# Patient Record
Sex: Male | Born: 1960 | Hispanic: No | Marital: Married | Smoking: Never smoker
Health system: Southern US, Community
[De-identification: ages and names within clinical notes are randomized; demographics above are authoritative.]

## PROBLEM LIST (undated history)

## (undated) DIAGNOSIS — K219 Gastro-esophageal reflux disease without esophagitis: Secondary | ICD-10-CM

## (undated) DIAGNOSIS — I10 Essential (primary) hypertension: Secondary | ICD-10-CM

## (undated) DIAGNOSIS — E119 Type 2 diabetes mellitus without complications: Secondary | ICD-10-CM

---

## 2007-08-25 ENCOUNTER — Encounter: Admission: RE | Admit: 2007-08-25 | Discharge: 2007-08-25 | Payer: Self-pay | Admitting: General Surgery

## 2007-08-29 ENCOUNTER — Encounter (INDEPENDENT_AMBULATORY_CARE_PROVIDER_SITE_OTHER): Payer: Self-pay | Admitting: General Surgery

## 2007-08-29 ENCOUNTER — Ambulatory Visit (HOSPITAL_BASED_OUTPATIENT_CLINIC_OR_DEPARTMENT_OTHER): Admission: RE | Admit: 2007-08-29 | Discharge: 2007-08-29 | Payer: Self-pay | Admitting: General Surgery

## 2009-08-12 ENCOUNTER — Encounter: Admission: RE | Admit: 2009-08-12 | Discharge: 2009-08-12 | Payer: Self-pay | Admitting: Specialist

## 2011-03-03 NOTE — Op Note (Signed)
[name], Kenneth Perez NO.:  [id number]   MEDICAL RECORD NO.:  [id number]          PATIENT TYPE:  AMB   LOCATION:  DSC                          FACILITY:  [facility]   PHYSICIAN:  Cherylynn Ridges, M.D.    DATE OF BIRTH:  Nov 14, 1960   DATE OF PROCEDURE:  08/29/2007  DATE OF DISCHARGE:                               OPERATIVE REPORT   PREOPERATIVE DIAGNOSES:  Perianal cyst with perianal pain.   POSTOPERATIVE DIAGNOSES:  1. Perianal cyst with perianal pain.  2. Internal hemorrhoid at the 11 o'clock position.   PROCEDURE:  1. Exam under anesthesia.  2. Excision of perianal cyst.  3. Internal hemorrhoid rubber band ligation.   SURGEON:  Dr. Jimmye Norman.   ANESTHESIA:  General with a laryngeal airway.   ESTIMATED BLOOD LOSS:  Less than 20 mL.   COMPLICATIONS:  None.   CONDITION:  Stable.   FINDINGS:  The patient had a 1 cm cyst at the 5 o'clock position with 12  o'clock being directly anterior in the lithotomy position.  There was no  associated hemorrhoidal disease with the cyst.  At approximately the 11  o'clock position, there was a very friable and enlarged and inflamed  internal hemorrhoid which we ligated with a rubber band.   DESCRIPTION OF PROCEDURE:  The patient was taken to the operating room,  placed on the table in supine position.  After an adequate general  laryngeal airway anesthetic was administered, he was placed in lithotomy  position and then prepped and draped in the usual sterile manner.   We placed an anal speculum inside and inspected the area around the  perianal cyst which could be seen externally.  3-0 chromic stitch was  placed just above the dentate line and ligated and then we excised the  perianal cyst at the 5 o'clock position and sent it as a specimen.  We  ran the locking stitch of 3-0 chromic up to the skin leaving a small  area open for drainage.   We readjusted the anal speculum in order to inspect the circumferential  area  of the hemorrhoidal vessels.  The only friable and inflamed  hemorrhoidal disease was noted at the 11 o'clock position and because it  was only mild disease and not causing significant prolapse, we were able  to ligate that using a rubber band ligator with good results above  the dentate line.  We then placed a roll of Gelfoam completely covered  with dibucaine ointment into the anus and injected around the perianal  area with 0.25% Marcaine without epi.  All counts were correct.  A  sterile dressing was applied.      Cherylynn Ridges, M.D.  Electronically Signed     JOW/MEDQ  D:  08/29/2007  T:  08/29/2007  Job:  [id number]   cc:   Markham Jordan L. Effie Shy, M.D.

## 2011-04-30 ENCOUNTER — Emergency Department (HOSPITAL_COMMUNITY): Payer: BC Managed Care – PPO

## 2011-04-30 ENCOUNTER — Emergency Department (HOSPITAL_COMMUNITY)
Admission: EM | Admit: 2011-04-30 | Discharge: 2011-04-30 | Disposition: A | Discharge status: Home / Self Care | Payer: BC Managed Care – PPO | Attending: Emergency Medicine | Admitting: Emergency Medicine

## 2011-04-30 DIAGNOSIS — R042 Hemoptysis: Secondary | ICD-10-CM | POA: Insufficient documentation

## 2011-04-30 LAB — URINALYSIS, ROUTINE W REFLEX MICROSCOPIC
Bilirubin Urine: NEGATIVE
Glucose, UA: NEGATIVE mg/dL
Ketones, ur: NEGATIVE mg/dL
Leukocytes, UA: NEGATIVE
Nitrite: NEGATIVE
Protein, ur: NEGATIVE mg/dL
Specific Gravity, Urine: 1.026 (ref 1.005–1.030)
Urobilinogen, UA: 1 mg/dL (ref 0.0–1.0)
pH: 6.5 (ref 5.0–8.0)

## 2011-04-30 LAB — DIFFERENTIAL
Basophils Absolute: 0 10*3/uL (ref 0.0–0.1)
Basophils Relative: 1 % (ref 0–1)
Eosinophils Absolute: 0.1 10*3/uL (ref 0.0–0.7)
Eosinophils Relative: 1 % (ref 0–5)
Lymphocytes Relative: 43 % (ref 12–46)
Lymphs Abs: 3 10*3/uL (ref 0.7–4.0)
Monocytes Absolute: 0.6 10*3/uL (ref 0.1–1.0)
Monocytes Relative: 8 % (ref 3–12)
Neutro Abs: 3.3 10*3/uL (ref 1.7–7.7)
Neutrophils Relative %: 48 % (ref 43–77)

## 2011-04-30 LAB — CBC
HCT: 41.5 % (ref 39.0–52.0)
Hemoglobin: 15.1 g/dL (ref 13.0–17.0)
MCH: 28.3 pg (ref 26.0–34.0)
MCHC: 36.4 g/dL — ABNORMAL HIGH (ref 30.0–36.0)
MCV: 77.9 fL — ABNORMAL LOW (ref 78.0–100.0)
Platelets: 209 10*3/uL (ref 150–400)
RBC: 5.33 MIL/uL (ref 4.22–5.81)
RDW: 13 % (ref 11.5–15.5)
WBC: 7 10*3/uL (ref 4.0–10.5)

## 2011-04-30 LAB — URINE MICROSCOPIC-ADD ON

## 2011-07-28 LAB — DIFFERENTIAL
Basophils Absolute: 0.1
Basophils Relative: 1
Eosinophils Absolute: 0.1
Eosinophils Relative: 1
Lymphocytes Relative: 35
Lymphs Abs: 2.3
Monocytes Absolute: 0.5
Monocytes Relative: 7
Neutro Abs: 3.6
Neutrophils Relative %: 56

## 2011-07-28 LAB — CBC
HCT: 49.3
Hemoglobin: 16.5
MCHC: 33.6
MCV: 81.8
Platelets: 239
RBC: 6.02 — ABNORMAL HIGH
RDW: 13.5
WBC: 6.6

## 2011-07-28 LAB — BASIC METABOLIC PANEL
BUN: 11
CO2: 29
Calcium: 9.2
Chloride: 102
Creatinine, Ser: 0.83
GFR calc Af Amer: >60
GFR calc non Af Amer: >60
Glucose, Bld: 124 — ABNORMAL HIGH
Potassium: 4.1
Sodium: 138

## 2011-07-28 LAB — POCT HEMOGLOBIN-HEMACUE
Hemoglobin: 17.2 — ABNORMAL HIGH
Operator id: 123881

## 2016-08-26 ENCOUNTER — Emergency Department (HOSPITAL_COMMUNITY)
Admission: EM | Admit: 2016-08-26 | Discharge: 2016-08-26 | Disposition: A | Discharge status: Home / Self Care | Payer: No Typology Code available for payment source | Attending: Emergency Medicine | Admitting: Emergency Medicine

## 2016-08-26 ENCOUNTER — Emergency Department (HOSPITAL_COMMUNITY): Payer: No Typology Code available for payment source

## 2016-08-26 ENCOUNTER — Encounter (HOSPITAL_COMMUNITY): Payer: Self-pay | Admitting: Emergency Medicine

## 2016-08-26 DIAGNOSIS — R930 Abnormal findings on diagnostic imaging of skull and head, not elsewhere classified: Secondary | ICD-10-CM | POA: Diagnosis not present

## 2016-08-26 DIAGNOSIS — R938 Abnormal findings on diagnostic imaging of other specified body structures: Secondary | ICD-10-CM | POA: Insufficient documentation

## 2016-08-26 DIAGNOSIS — Y939 Activity, unspecified: Secondary | ICD-10-CM | POA: Insufficient documentation

## 2016-08-26 DIAGNOSIS — S199XXA Unspecified injury of neck, initial encounter: Secondary | ICD-10-CM | POA: Diagnosis present

## 2016-08-26 DIAGNOSIS — Y92410 Unspecified street and highway as the place of occurrence of the external cause: Secondary | ICD-10-CM | POA: Insufficient documentation

## 2016-08-26 DIAGNOSIS — Y999 Unspecified external cause status: Secondary | ICD-10-CM | POA: Insufficient documentation

## 2016-08-26 LAB — I-STAT CHEM 8, ED
BUN: 15 mg/dL (ref 6–20)
Calcium, Ion: 1.09 mmol/L — ABNORMAL LOW (ref 1.15–1.40)
Chloride: 102 mmol/L (ref 101–111)
Creatinine, Ser: 0.7 mg/dL (ref 0.61–1.24)
Glucose, Bld: 136 mg/dL — ABNORMAL HIGH (ref 65–99)
HCT: 48 % (ref 39.0–52.0)
Hemoglobin: 16.3 g/dL (ref 13.0–17.0)
Potassium: 3.6 mmol/L (ref 3.5–5.1)
Sodium: 138 mmol/L (ref 135–145)
TCO2: 25 mmol/L (ref 0–100)

## 2016-08-26 MED ORDER — IOPAMIDOL (ISOVUE-300) INJECTION 61%
INTRAVENOUS | Status: AC
  Administered 2016-08-26: 85 mL via INTRAVENOUS
  Filled 2016-08-26: qty 100

## 2016-08-26 MED ORDER — METHOCARBAMOL 500 MG PO TABS: 500.0000 mg | ORAL_TABLET | Freq: Two times a day (BID) | ORAL | 0 refills | Status: DC
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
## 2016-08-26 NOTE — ED Notes (Signed)
Pt's wife continually coming to desk, asking to leave.
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
## 2016-08-26 NOTE — Discharge Instructions (Signed)
You have a small inguinal hernia on the right side. If it becomes more swollen and painful follow-up with the surgeon.
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
## 2017-04-26 DIAGNOSIS — Z131 Encounter for screening for diabetes mellitus: Secondary | ICD-10-CM | POA: Diagnosis not present

## 2017-04-26 DIAGNOSIS — Z13220 Encounter for screening for lipoid disorders: Secondary | ICD-10-CM | POA: Diagnosis not present

## 2017-04-26 DIAGNOSIS — M25552 Pain in left hip: Secondary | ICD-10-CM | POA: Diagnosis not present

## 2017-04-26 DIAGNOSIS — Z125 Encounter for screening for malignant neoplasm of prostate: Secondary | ICD-10-CM | POA: Diagnosis not present

## 2017-04-26 DIAGNOSIS — K219 Gastro-esophageal reflux disease without esophagitis: Secondary | ICD-10-CM | POA: Diagnosis not present

## 2017-04-26 DIAGNOSIS — I10 Essential (primary) hypertension: Secondary | ICD-10-CM | POA: Diagnosis not present

## 2017-04-26 DIAGNOSIS — R7303 Prediabetes: Secondary | ICD-10-CM | POA: Diagnosis not present

## 2017-04-26 DIAGNOSIS — M25559 Pain in unspecified hip: Secondary | ICD-10-CM | POA: Diagnosis not present

## 2017-05-06 DIAGNOSIS — R7303 Prediabetes: Secondary | ICD-10-CM | POA: Diagnosis not present

## 2017-05-06 DIAGNOSIS — E669 Obesity, unspecified: Secondary | ICD-10-CM | POA: Diagnosis not present

## 2017-05-06 DIAGNOSIS — K219 Gastro-esophageal reflux disease without esophagitis: Secondary | ICD-10-CM | POA: Diagnosis not present

## 2017-05-06 DIAGNOSIS — I10 Essential (primary) hypertension: Secondary | ICD-10-CM | POA: Diagnosis not present

## 2017-05-11 DIAGNOSIS — H5203 Hypermetropia, bilateral: Secondary | ICD-10-CM | POA: Diagnosis not present

## 2017-06-11 DIAGNOSIS — K219 Gastro-esophageal reflux disease without esophagitis: Secondary | ICD-10-CM | POA: Diagnosis not present

## 2017-06-11 DIAGNOSIS — M25559 Pain in unspecified hip: Secondary | ICD-10-CM | POA: Diagnosis not present

## 2017-06-11 DIAGNOSIS — I10 Essential (primary) hypertension: Secondary | ICD-10-CM | POA: Diagnosis not present

## 2017-06-11 DIAGNOSIS — R7303 Prediabetes: Secondary | ICD-10-CM | POA: Diagnosis not present

## 2017-08-30 DIAGNOSIS — R7303 Prediabetes: Secondary | ICD-10-CM | POA: Diagnosis not present

## 2017-08-30 DIAGNOSIS — N3001 Acute cystitis with hematuria: Secondary | ICD-10-CM | POA: Diagnosis not present

## 2017-08-30 DIAGNOSIS — I10 Essential (primary) hypertension: Secondary | ICD-10-CM | POA: Diagnosis not present

## 2017-10-08 DIAGNOSIS — N139 Obstructive and reflux uropathy, unspecified: Secondary | ICD-10-CM | POA: Diagnosis not present

## 2017-10-08 DIAGNOSIS — I10 Essential (primary) hypertension: Secondary | ICD-10-CM | POA: Diagnosis not present

## 2017-10-08 DIAGNOSIS — E1165 Type 2 diabetes mellitus with hyperglycemia: Secondary | ICD-10-CM | POA: Diagnosis not present

## 2017-10-08 DIAGNOSIS — K219 Gastro-esophageal reflux disease without esophagitis: Secondary | ICD-10-CM | POA: Diagnosis not present

## 2018-02-04 DIAGNOSIS — Z125 Encounter for screening for malignant neoplasm of prostate: Secondary | ICD-10-CM | POA: Diagnosis not present

## 2018-02-04 DIAGNOSIS — K219 Gastro-esophageal reflux disease without esophagitis: Secondary | ICD-10-CM | POA: Diagnosis not present

## 2018-02-04 DIAGNOSIS — I10 Essential (primary) hypertension: Secondary | ICD-10-CM | POA: Diagnosis not present

## 2018-02-04 DIAGNOSIS — E1165 Type 2 diabetes mellitus with hyperglycemia: Secondary | ICD-10-CM | POA: Diagnosis not present

## 2018-02-04 DIAGNOSIS — M25519 Pain in unspecified shoulder: Secondary | ICD-10-CM | POA: Diagnosis not present

## 2018-03-25 DIAGNOSIS — N139 Obstructive and reflux uropathy, unspecified: Secondary | ICD-10-CM | POA: Diagnosis not present

## 2018-03-25 DIAGNOSIS — I10 Essential (primary) hypertension: Secondary | ICD-10-CM | POA: Diagnosis not present

## 2018-03-25 DIAGNOSIS — E1165 Type 2 diabetes mellitus with hyperglycemia: Secondary | ICD-10-CM | POA: Diagnosis not present

## 2018-03-25 DIAGNOSIS — Z1211 Encounter for screening for malignant neoplasm of colon: Secondary | ICD-10-CM | POA: Diagnosis not present

## 2018-03-25 DIAGNOSIS — E7849 Other hyperlipidemia: Secondary | ICD-10-CM | POA: Diagnosis not present

## 2018-04-13 ENCOUNTER — Encounter: Payer: Self-pay | Admitting: Emergency Medicine

## 2018-04-13 ENCOUNTER — Emergency Department
Admission: EM | Admit: 2018-04-13 | Discharge: 2018-04-13 | Disposition: A | Discharge status: Home / Self Care | Payer: Worker's Compensation | Attending: Emergency Medicine | Admitting: Emergency Medicine

## 2018-04-13 ENCOUNTER — Other Ambulatory Visit: Payer: Self-pay

## 2018-04-13 DIAGNOSIS — Z23 Encounter for immunization: Secondary | ICD-10-CM | POA: Insufficient documentation

## 2018-04-13 DIAGNOSIS — S51812A Laceration without foreign body of left forearm, initial encounter: Secondary | ICD-10-CM | POA: Insufficient documentation

## 2018-04-13 DIAGNOSIS — Y9389 Activity, other specified: Secondary | ICD-10-CM | POA: Insufficient documentation

## 2018-04-13 DIAGNOSIS — I10 Essential (primary) hypertension: Secondary | ICD-10-CM | POA: Insufficient documentation

## 2018-04-13 DIAGNOSIS — W268XXA Contact with other sharp object(s), not elsewhere classified, initial encounter: Secondary | ICD-10-CM | POA: Insufficient documentation

## 2018-04-13 DIAGNOSIS — Y990 Civilian activity done for income or pay: Secondary | ICD-10-CM | POA: Insufficient documentation

## 2018-04-13 DIAGNOSIS — Y9289 Other specified places as the place of occurrence of the external cause: Secondary | ICD-10-CM | POA: Insufficient documentation

## 2018-04-13 HISTORY — DX: Gastro-esophageal reflux disease without esophagitis: K21.9

## 2018-04-13 HISTORY — DX: Essential (primary) hypertension: I10

## 2018-04-13 MED ORDER — LIDOCAINE HCL (PF) 1 % IJ SOLN: 5.0000 mL | Freq: Once | INTRAMUSCULAR | Status: AC

## 2018-04-13 MED ORDER — LIDOCAINE HCL (PF) 1 % IJ SOLN
INTRAMUSCULAR | Status: AC
  Administered 2018-04-13: 5 mL
  Filled 2018-04-13: qty 5

## 2018-04-13 MED ORDER — TETANUS-DIPHTH-ACELL PERTUSSIS 5-2.5-18.5 LF-MCG/0.5 IM SUSP
0.5000 mL | Freq: Once | INTRAMUSCULAR | Status: AC
  Administered 2018-04-13: 0.5 mL via INTRAMUSCULAR
  Filled 2018-04-13: qty 0.5

## 2018-04-13 MED ORDER — TRAMADOL HCL 50 MG PO TABS: 50.0000 mg | ORAL_TABLET | Freq: Two times a day (BID) | ORAL | 0 refills | Status: AC | PRN

## 2018-04-13 MED ORDER — BACITRACIN-NEOMYCIN-POLYMYXIN 400-5-5000 EX OINT
TOPICAL_OINTMENT | Freq: Once | CUTANEOUS | Status: AC
  Administered 2018-04-13: 1 via TOPICAL
  Filled 2018-04-13: qty 1

## 2018-05-06 DIAGNOSIS — E1165 Type 2 diabetes mellitus with hyperglycemia: Secondary | ICD-10-CM | POA: Diagnosis not present

## 2018-05-06 DIAGNOSIS — K219 Gastro-esophageal reflux disease without esophagitis: Secondary | ICD-10-CM | POA: Diagnosis not present

## 2018-05-06 DIAGNOSIS — I10 Essential (primary) hypertension: Secondary | ICD-10-CM | POA: Diagnosis not present

## 2018-05-06 DIAGNOSIS — S41009A Unspecified open wound of unspecified shoulder, initial encounter: Secondary | ICD-10-CM | POA: Diagnosis not present

## 2018-06-30 DIAGNOSIS — I10 Essential (primary) hypertension: Secondary | ICD-10-CM | POA: Diagnosis not present

## 2018-06-30 DIAGNOSIS — K219 Gastro-esophageal reflux disease without esophagitis: Secondary | ICD-10-CM | POA: Diagnosis not present

## 2018-06-30 DIAGNOSIS — Z418 Encounter for other procedures for purposes other than remedying health state: Secondary | ICD-10-CM | POA: Diagnosis not present

## 2018-06-30 DIAGNOSIS — E1165 Type 2 diabetes mellitus with hyperglycemia: Secondary | ICD-10-CM | POA: Diagnosis not present
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
## 2018-11-15 DIAGNOSIS — I10 Essential (primary) hypertension: Secondary | ICD-10-CM | POA: Diagnosis not present

## 2018-11-15 DIAGNOSIS — E1165 Type 2 diabetes mellitus with hyperglycemia: Secondary | ICD-10-CM | POA: Diagnosis not present

## 2018-11-15 DIAGNOSIS — J302 Other seasonal allergic rhinitis: Secondary | ICD-10-CM | POA: Diagnosis not present

## 2018-11-15 DIAGNOSIS — K219 Gastro-esophageal reflux disease without esophagitis: Secondary | ICD-10-CM | POA: Diagnosis not present

## 2018-12-27 DIAGNOSIS — K219 Gastro-esophageal reflux disease without esophagitis: Secondary | ICD-10-CM | POA: Diagnosis not present

## 2018-12-27 DIAGNOSIS — E1165 Type 2 diabetes mellitus with hyperglycemia: Secondary | ICD-10-CM | POA: Diagnosis not present

## 2018-12-27 DIAGNOSIS — I10 Essential (primary) hypertension: Secondary | ICD-10-CM | POA: Diagnosis not present

## 2018-12-27 DIAGNOSIS — J302 Other seasonal allergic rhinitis: Secondary | ICD-10-CM | POA: Diagnosis not present

## 2019-05-29 DIAGNOSIS — I10 Essential (primary) hypertension: Secondary | ICD-10-CM | POA: Diagnosis not present

## 2019-05-29 DIAGNOSIS — E559 Vitamin D deficiency, unspecified: Secondary | ICD-10-CM | POA: Diagnosis not present

## 2019-05-29 DIAGNOSIS — Z0000 Encounter for general adult medical examination without abnormal findings: Secondary | ICD-10-CM | POA: Diagnosis not present

## 2019-05-29 DIAGNOSIS — E1165 Type 2 diabetes mellitus with hyperglycemia: Secondary | ICD-10-CM | POA: Diagnosis not present

## 2019-05-29 DIAGNOSIS — K219 Gastro-esophageal reflux disease without esophagitis: Secondary | ICD-10-CM | POA: Diagnosis not present

## 2019-05-29 DIAGNOSIS — Z125 Encounter for screening for malignant neoplasm of prostate: Secondary | ICD-10-CM | POA: Diagnosis not present

## 2019-10-30 DIAGNOSIS — M25579 Pain in unspecified ankle and joints of unspecified foot: Secondary | ICD-10-CM | POA: Diagnosis not present

## 2019-10-30 DIAGNOSIS — I10 Essential (primary) hypertension: Secondary | ICD-10-CM | POA: Diagnosis not present

## 2019-10-30 DIAGNOSIS — E1165 Type 2 diabetes mellitus with hyperglycemia: Secondary | ICD-10-CM | POA: Diagnosis not present

## 2019-10-30 DIAGNOSIS — K219 Gastro-esophageal reflux disease without esophagitis: Secondary | ICD-10-CM | POA: Diagnosis not present

## 2019-12-11 DIAGNOSIS — K219 Gastro-esophageal reflux disease without esophagitis: Secondary | ICD-10-CM | POA: Diagnosis not present

## 2019-12-11 DIAGNOSIS — N529 Male erectile dysfunction, unspecified: Secondary | ICD-10-CM | POA: Diagnosis not present

## 2019-12-11 DIAGNOSIS — I10 Essential (primary) hypertension: Secondary | ICD-10-CM | POA: Diagnosis not present

## 2019-12-11 DIAGNOSIS — E1165 Type 2 diabetes mellitus with hyperglycemia: Secondary | ICD-10-CM | POA: Diagnosis not present

## 2019-12-21 DIAGNOSIS — Z20828 Contact with and (suspected) exposure to other viral communicable diseases: Secondary | ICD-10-CM | POA: Diagnosis not present

## 2019-12-22 DIAGNOSIS — Z20828 Contact with and (suspected) exposure to other viral communicable diseases: Secondary | ICD-10-CM | POA: Diagnosis not present

## 2020-01-18 DIAGNOSIS — I10 Essential (primary) hypertension: Secondary | ICD-10-CM | POA: Diagnosis not present

## 2020-01-18 DIAGNOSIS — M179 Osteoarthritis of knee, unspecified: Secondary | ICD-10-CM | POA: Diagnosis not present

## 2020-01-18 DIAGNOSIS — K219 Gastro-esophageal reflux disease without esophagitis: Secondary | ICD-10-CM | POA: Diagnosis not present

## 2020-01-18 DIAGNOSIS — E1165 Type 2 diabetes mellitus with hyperglycemia: Secondary | ICD-10-CM | POA: Diagnosis not present

## 2020-02-05 DIAGNOSIS — E1165 Type 2 diabetes mellitus with hyperglycemia: Secondary | ICD-10-CM | POA: Diagnosis not present

## 2020-02-05 DIAGNOSIS — K219 Gastro-esophageal reflux disease without esophagitis: Secondary | ICD-10-CM | POA: Diagnosis not present

## 2020-02-05 DIAGNOSIS — M179 Osteoarthritis of knee, unspecified: Secondary | ICD-10-CM | POA: Diagnosis not present

## 2020-02-05 DIAGNOSIS — I10 Essential (primary) hypertension: Secondary | ICD-10-CM | POA: Diagnosis not present

## 2020-03-29 DIAGNOSIS — K219 Gastro-esophageal reflux disease without esophagitis: Secondary | ICD-10-CM | POA: Diagnosis not present

## 2020-03-29 DIAGNOSIS — E1165 Type 2 diabetes mellitus with hyperglycemia: Secondary | ICD-10-CM | POA: Diagnosis not present

## 2020-03-29 DIAGNOSIS — I10 Essential (primary) hypertension: Secondary | ICD-10-CM | POA: Diagnosis not present

## 2020-03-29 DIAGNOSIS — M179 Osteoarthritis of knee, unspecified: Secondary | ICD-10-CM | POA: Diagnosis not present

## 2020-05-24 DIAGNOSIS — Z0000 Encounter for general adult medical examination without abnormal findings: Secondary | ICD-10-CM | POA: Diagnosis not present

## 2020-05-24 DIAGNOSIS — Z13220 Encounter for screening for lipoid disorders: Secondary | ICD-10-CM | POA: Diagnosis not present

## 2020-05-24 DIAGNOSIS — Z125 Encounter for screening for malignant neoplasm of prostate: Secondary | ICD-10-CM | POA: Diagnosis not present

## 2020-05-24 DIAGNOSIS — E1165 Type 2 diabetes mellitus with hyperglycemia: Secondary | ICD-10-CM | POA: Diagnosis not present

## 2020-05-24 DIAGNOSIS — I10 Essential (primary) hypertension: Secondary | ICD-10-CM | POA: Diagnosis not present

## 2020-05-24 DIAGNOSIS — E559 Vitamin D deficiency, unspecified: Secondary | ICD-10-CM | POA: Diagnosis not present

## 2020-05-24 DIAGNOSIS — M179 Osteoarthritis of knee, unspecified: Secondary | ICD-10-CM | POA: Diagnosis not present

## 2020-07-05 DIAGNOSIS — K219 Gastro-esophageal reflux disease without esophagitis: Secondary | ICD-10-CM | POA: Diagnosis not present

## 2020-07-05 DIAGNOSIS — E1165 Type 2 diabetes mellitus with hyperglycemia: Secondary | ICD-10-CM | POA: Diagnosis not present

## 2020-07-05 DIAGNOSIS — M179 Osteoarthritis of knee, unspecified: Secondary | ICD-10-CM | POA: Diagnosis not present

## 2020-07-05 DIAGNOSIS — I10 Essential (primary) hypertension: Secondary | ICD-10-CM | POA: Diagnosis not present

## 2020-08-07 ENCOUNTER — Other Ambulatory Visit: Payer: Self-pay

## 2020-08-07 ENCOUNTER — Ambulatory Visit: Payer: BC Managed Care – PPO | Admitting: Podiatry

## 2020-08-07 ENCOUNTER — Encounter: Payer: Self-pay | Admitting: Podiatry

## 2020-08-07 DIAGNOSIS — M2580 Other specified joint disorders, unspecified joint: Secondary | ICD-10-CM

## 2020-08-07 DIAGNOSIS — Q666 Other congenital valgus deformities of feet: Secondary | ICD-10-CM | POA: Diagnosis not present

## 2020-08-07 NOTE — Progress Notes (Signed)
Subjective:  Patient ID: Kenneth Perez, male    DOB: [birth year],  MRN: [id number]  Chief Complaint  Patient presents with  . routine foot care    diabetic foot exam     59 y.o. male presents with the above complaint.  Patient presents with a complaint of left submetatarsal 1 pain that has been going on for quite some time.  Patient stated constantly works on his foot.  He works with [employer] on.  He would like to know if there is any kind of orthotics or insoles that he could obtain that could help take some of the pain away as well as if there is any other treatment options.  He has not seen anyone else prior to seeing me.  He denies any other acute complaints.   Review of Systems: Negative except as noted in the HPI. Denies N/V/F/Ch.  History reviewed. No pertinent past medical history.  Current Outpatient Medications:  .  celecoxib (CELEBREX) 200 MG capsule, Take 200 mg by mouth daily., Disp: , Rfl:  .  ibuprofen (ADVIL,MOTRIN) 200 MG tablet, Take 200 mg by mouth every 6 (six) hours as needed for mild pain., Disp: , Rfl:  .  losartan-hydrochlorothiazide (HYZAAR) 100-12.5 MG tablet, Take 1 tablet by mouth daily., Disp: , Rfl:  .  methocarbamol (ROBAXIN) 500 MG tablet, Take 1 tablet (500 mg total) by mouth 2 (two) times daily., Disp: 10 tablet, Rfl: 0 .  naproxen (NAPROSYN) 500 MG tablet, Take 500 mg by mouth 2 (two) times daily., Disp: , Rfl:  .  omeprazole (PRILOSEC) 20 MG capsule, Take 20 mg by mouth 2 (two) times daily., Disp: , Rfl: 5 .  tamsulosin (FLOMAX) 0.4 MG CAPS capsule, Take 0.4 mg by mouth daily., Disp: , Rfl:  .  Vitamin D, Ergocalciferol, (DRISDOL) 1.25 MG (50000 UNIT) CAPS capsule, Take 50,000 Units by mouth once a week., Disp: , Rfl:   Social History   Tobacco Use  Smoking Status Never Smoker  Smokeless Tobacco Never Used    No Known Allergies Objective:  There were no vitals filed for this visit. There is no height or weight on file to calculate  BMI. Constitutional Well developed. Well nourished.  Vascular Dorsalis pedis pulses palpable bilaterally. Posterior tibial pulses palpable bilaterally. Capillary refill normal to all digits.  No cyanosis or clubbing noted. Pedal hair growth normal.  Neurologic Normal speech. Oriented to person, place, and time. Epicritic sensation to light touch grossly present bilaterally.  Dermatologic Nails well groomed and normal in appearance. No open wounds. No skin lesions.  Orthopedic:  Pain on palpation to left submetatarsal 1.  No intra-articular pain at the first MTPJ joint.  Pain on palpation to the tibial sesamoid complex.  No pain with range of motion of the first MPJ joint active and passive.   Radiographs: None Assessment:   1. Sesamoiditis   2. Pes planovalgus    Plan:  Patient was evaluated and treated and all questions answered.  Left foot sesamoiditis -I explained to the patient the etiology of sesamoiditis and various treatment options were discussed.  Given that patient is having a lot of pain I believe he will benefit from steroid injection to help decrease acute inflammatory component associated with pain.  Patient agrees with the plan would like to proceed with a steroid injection -A steroid injection was performed at left sesamoidal complex using 1% plain Lidocaine and 10 mg of Kenalog. This was well tolerated.  Pes planovalgus rigid -I explained to  patient the etiology of pes planovalgus and various treatment options were extensively discussed.  I believe he will benefit from custom-made orthotics to help control hindfoot motion as well as support the arch of the foot.  Patient agrees with the plan would like to obtain orthotics. -He will be scheduled to see [facility] for custom-made orthotics   Return in about 6 weeks (around 09/18/2020) for Precious Gilchrest .

## 2020-08-19 ENCOUNTER — Other Ambulatory Visit: Payer: Self-pay

## 2020-08-19 ENCOUNTER — Ambulatory Visit (INDEPENDENT_AMBULATORY_CARE_PROVIDER_SITE_OTHER): Payer: BC Managed Care – PPO | Admitting: Orthotics

## 2020-08-19 DIAGNOSIS — M2580 Other specified joint disorders, unspecified joint: Secondary | ICD-10-CM | POA: Diagnosis not present

## 2020-08-19 DIAGNOSIS — Q666 Other congenital valgus deformities of feet: Secondary | ICD-10-CM

## 2020-08-19 NOTE — Progress Notes (Signed)
F/O cast toay to address b/l sesmoiditis l > R; plan on k-wedge type of device.

## 2020-08-22 ENCOUNTER — Other Ambulatory Visit: Payer: BC Managed Care – PPO | Admitting: Orthotics

## 2020-08-23 DIAGNOSIS — E1165 Type 2 diabetes mellitus with hyperglycemia: Secondary | ICD-10-CM | POA: Diagnosis not present

## 2020-08-23 DIAGNOSIS — K219 Gastro-esophageal reflux disease without esophagitis: Secondary | ICD-10-CM | POA: Diagnosis not present

## 2020-08-23 DIAGNOSIS — Z23 Encounter for immunization: Secondary | ICD-10-CM | POA: Diagnosis not present

## 2020-08-23 DIAGNOSIS — M179 Osteoarthritis of knee, unspecified: Secondary | ICD-10-CM | POA: Diagnosis not present

## 2020-08-23 DIAGNOSIS — I10 Essential (primary) hypertension: Secondary | ICD-10-CM | POA: Diagnosis not present

## 2020-09-16 ENCOUNTER — Ambulatory Visit: Payer: BC Managed Care – PPO | Admitting: Orthotics

## 2020-09-16 ENCOUNTER — Other Ambulatory Visit: Payer: Self-pay

## 2020-09-16 DIAGNOSIS — Q666 Other congenital valgus deformities of feet: Secondary | ICD-10-CM

## 2020-09-16 DIAGNOSIS — M2580 Other specified joint disorders, unspecified joint: Secondary | ICD-10-CM

## 2020-09-16 NOTE — Progress Notes (Signed)
Patient picked up f/o and was pleased with fit, comfort, and function.  Worked well with footwear.  Told of rbeak in period and how to report any issues.  

## 2020-09-18 ENCOUNTER — Ambulatory Visit: Payer: BC Managed Care – PPO | Admitting: Podiatry

## 2020-09-18 ENCOUNTER — Other Ambulatory Visit: Payer: Self-pay

## 2020-09-18 DIAGNOSIS — M778 Other enthesopathies, not elsewhere classified: Secondary | ICD-10-CM

## 2020-09-18 DIAGNOSIS — M2580 Other specified joint disorders, unspecified joint: Secondary | ICD-10-CM | POA: Diagnosis not present

## 2020-09-19 ENCOUNTER — Encounter: Payer: Self-pay | Admitting: Podiatry

## 2020-09-19 MED ORDER — TRIAMCINOLONE ACETONIDE 10 MG/ML IJ SUSP
10.0000 mg | Freq: Once | INTRAMUSCULAR | Status: AC
  Administered 2020-09-19: 10 mg via INTRA_ARTICULAR

## 2020-09-19 NOTE — Progress Notes (Signed)
Subjective:  Patient ID: Kenneth Perez, male    DOB: April 14, 1961,  MRN: [id number]  Chief Complaint  Patient presents with  . Foot Pain    pt states that his left foot pain has improved but is starting to have pain.    59 y.o. male presents with the above complaint.  Patient presents with a follow-up of left submetatarsal 1 sesamoiditis.  Patient states the left side is doing much better is 90 to 95% improved after steroid injection.  The right side has started acting up and he would like to get another steroid injection there.  He denies any other acute complaints.   Review of Systems: Negative except as noted in the HPI. Denies N/V/F/Ch.  No past medical history on file.  Current Outpatient Medications:  .  celecoxib (CELEBREX) 200 MG capsule, Take 200 mg by mouth daily., Disp: , Rfl:  .  ibuprofen (ADVIL,MOTRIN) 200 MG tablet, Take 200 mg by mouth every 6 (six) hours as needed for mild pain., Disp: , Rfl:  .  losartan-hydrochlorothiazide (HYZAAR) 100-12.5 MG tablet, Take 1 tablet by mouth daily., Disp: , Rfl:  .  methocarbamol (ROBAXIN) 500 MG tablet, Take 1 tablet (500 mg total) by mouth 2 (two) times daily., Disp: 10 tablet, Rfl: 0 .  naproxen (NAPROSYN) 500 MG tablet, Take 500 mg by mouth 2 (two) times daily., Disp: , Rfl:  .  omeprazole (PRILOSEC) 20 MG capsule, Take 20 mg by mouth 2 (two) times daily., Disp: , Rfl: 5 .  tamsulosin (FLOMAX) 0.4 MG CAPS capsule, Take 0.4 mg by mouth daily., Disp: , Rfl:  .  Vitamin D, Ergocalciferol, (DRISDOL) 1.25 MG (50000 UNIT) CAPS capsule, Take 50,000 Units by mouth once a week., Disp: , Rfl:   Social History   Tobacco Use  Smoking Status Never Smoker  Smokeless Tobacco Never Used    No Known Allergies Objective:  There were no vitals filed for this visit. There is no height or weight on file to calculate BMI. Constitutional Well developed. Well nourished.  Vascular Dorsalis pedis pulses palpable bilaterally. Posterior tibial pulses  palpable bilaterally. Capillary refill normal to all digits.  No cyanosis or clubbing noted. Pedal hair growth normal.  Neurologic Normal speech. Oriented to person, place, and time. Epicritic sensation to light touch grossly present bilaterally.  Dermatologic Nails well groomed and normal in appearance. No open wounds. No skin lesions.  Orthopedic:  No pain on palpation to left submetatarsal 1.  No intra-articular pain at the first MTPJ joint.  No pain on palpation to the tibial sesamoid complex.  No pain with range of motion of the first MPJ joint active and passive.  Pain on palpation to the right submetatarsal 1.  No intra-articular pain noted the first metatarsophalangeal joint.  pain to the tibia sesamoidal complex.  No pain with range of motion of the first metatarsophalangeal joint active and passive   Radiographs: None Assessment:   1. Sesamoiditis   2. Capsulitis of right foot    Plan:  Patient was evaluated and treated and all questions answered.  Left foot sesamoiditis -Clinically healed with 1 steroid injection.  I discussed with her the importance of wearing orthotics and to prevent aggressive stress on the sesamoid complex.  Patient agrees with the plan.  Right sesamoiditis I explained to the patient the etiology of sesamoiditis and various treatment options were discussed.  Given that patient is having a lot of pain I believe he will benefit from steroid injection to help decrease  acute inflammatory component associated with pain.  Patient agrees with the plan would like to proceed with a steroid injection -A steroid injection was performed at left sesamoidal complex using 1% plain Lidocaine and 10 mg of Kenalog. This was well tolerated.  Pes planovalgus rigid -I explained to patient the etiology of pes planovalgus and various treatment options were extensively discussed.  I believe he will benefit from custom-made orthotics to help control hindfoot motion as well as  support the arch of the foot.  Patient agrees with the plan would like to obtain orthotics. -He will be scheduled to see [facility] for custom-made orthotics   No follow-ups on file.

## 2020-11-01 DIAGNOSIS — Z418 Encounter for other procedures for purposes other than remedying health state: Secondary | ICD-10-CM | POA: Diagnosis not present

## 2020-11-01 DIAGNOSIS — I10 Essential (primary) hypertension: Secondary | ICD-10-CM | POA: Diagnosis not present

## 2020-11-01 DIAGNOSIS — M179 Osteoarthritis of knee, unspecified: Secondary | ICD-10-CM | POA: Diagnosis not present

## 2020-11-01 DIAGNOSIS — E1165 Type 2 diabetes mellitus with hyperglycemia: Secondary | ICD-10-CM | POA: Diagnosis not present

## 2020-11-22 ENCOUNTER — Other Ambulatory Visit: Payer: Self-pay

## 2020-11-23 DIAGNOSIS — Z20822 Contact with and (suspected) exposure to covid-19: Secondary | ICD-10-CM | POA: Diagnosis not present

## 2020-11-25 DIAGNOSIS — Z20822 Contact with and (suspected) exposure to covid-19: Secondary | ICD-10-CM | POA: Diagnosis not present

## 2021-03-07 DIAGNOSIS — M179 Osteoarthritis of knee, unspecified: Secondary | ICD-10-CM | POA: Diagnosis not present

## 2021-03-07 DIAGNOSIS — I10 Essential (primary) hypertension: Secondary | ICD-10-CM | POA: Diagnosis not present

## 2021-03-07 DIAGNOSIS — N139 Obstructive and reflux uropathy, unspecified: Secondary | ICD-10-CM | POA: Diagnosis not present

## 2021-03-07 DIAGNOSIS — E1165 Type 2 diabetes mellitus with hyperglycemia: Secondary | ICD-10-CM | POA: Diagnosis not present

## 2021-04-10 DIAGNOSIS — I10 Essential (primary) hypertension: Secondary | ICD-10-CM | POA: Diagnosis not present

## 2021-04-10 DIAGNOSIS — E1165 Type 2 diabetes mellitus with hyperglycemia: Secondary | ICD-10-CM | POA: Diagnosis not present

## 2021-04-10 DIAGNOSIS — K219 Gastro-esophageal reflux disease without esophagitis: Secondary | ICD-10-CM | POA: Diagnosis not present

## 2021-04-10 DIAGNOSIS — N139 Obstructive and reflux uropathy, unspecified: Secondary | ICD-10-CM | POA: Diagnosis not present

## 2021-04-25 ENCOUNTER — Other Ambulatory Visit: Payer: Self-pay | Admitting: Internal Medicine

## 2021-04-25 DIAGNOSIS — Z0000 Encounter for general adult medical examination without abnormal findings: Secondary | ICD-10-CM | POA: Diagnosis not present

## 2021-04-25 DIAGNOSIS — Z125 Encounter for screening for malignant neoplasm of prostate: Secondary | ICD-10-CM | POA: Diagnosis not present

## 2021-04-25 DIAGNOSIS — I10 Essential (primary) hypertension: Secondary | ICD-10-CM | POA: Diagnosis not present

## 2021-04-25 DIAGNOSIS — E1165 Type 2 diabetes mellitus with hyperglycemia: Secondary | ICD-10-CM | POA: Diagnosis not present

## 2021-04-25 DIAGNOSIS — K4090 Unilateral inguinal hernia, without obstruction or gangrene, not specified as recurrent: Secondary | ICD-10-CM | POA: Diagnosis not present

## 2021-04-25 DIAGNOSIS — E559 Vitamin D deficiency, unspecified: Secondary | ICD-10-CM | POA: Diagnosis not present

## 2021-04-25 DIAGNOSIS — Z1211 Encounter for screening for malignant neoplasm of colon: Secondary | ICD-10-CM | POA: Diagnosis not present

## 2021-04-26 LAB — CBC
HCT: 45.3 % (ref 38.5–50.0)
Hemoglobin: 15.4 g/dL (ref 13.2–17.1)
MCH: 27.8 pg (ref 27.0–33.0)
MCHC: 34 g/dL (ref 32.0–36.0)
MCV: 81.8 fL (ref 80.0–100.0)
MPV: 10.5 fL (ref 7.5–12.5)
Platelets: 208 10*3/uL (ref 140–400)
RBC: 5.54 10*6/uL (ref 4.20–5.80)
RDW: 13 % (ref 11.0–15.0)
WBC: 5.9 10*3/uL (ref 3.8–10.8)

## 2021-04-26 LAB — PSA: PSA: 0.47 ng/mL (ref <=4.00)

## 2021-04-26 LAB — LIPID PANEL
Cholesterol: 199 mg/dL (ref <200)
HDL: 50 mg/dL (ref >=40)
LDL Cholesterol (Calc): 124 mg/dL (calc) — ABNORMAL HIGH
Non-HDL Cholesterol (Calc): 149 mg/dL (calc) — ABNORMAL HIGH (ref <130)
Total CHOL/HDL Ratio: 4 (calc) (ref <5.0)
Triglycerides: 135 mg/dL (ref <150)

## 2021-04-26 LAB — COMPLETE METABOLIC PANEL WITH GFR
AG Ratio: 1.8 (calc) (ref 1.0–2.5)
ALT: 37 U/L (ref 9–46)
AST: 29 U/L (ref 10–35)
Albumin: 4.8 g/dL (ref 3.6–5.1)
Alkaline phosphatase (APISO): 56 U/L (ref 35–144)
BUN: 16 mg/dL (ref 7–25)
CO2: 26 mmol/L (ref 20–32)
Calcium: 9.6 mg/dL (ref 8.6–10.3)
Chloride: 101 mmol/L (ref 98–110)
Creat: 0.82 mg/dL (ref 0.70–1.33)
GFR, Est African American: 113 mL/min/{1.73_m2} (ref >=60)
GFR, Est Non African American: 97 mL/min/{1.73_m2} (ref >=60)
Globulin: 2.7 g/dL (calc) (ref 1.9–3.7)
Glucose, Bld: 115 mg/dL — ABNORMAL HIGH (ref 65–99)
Potassium: 4.1 mmol/L (ref 3.5–5.3)
Sodium: 140 mmol/L (ref 135–146)
Total Bilirubin: 0.8 mg/dL (ref 0.2–1.2)
Total Protein: 7.5 g/dL (ref 6.1–8.1)

## 2021-04-26 LAB — TSH: TSH: 2.02 mIU/L (ref 0.40–4.50)

## 2021-04-26 LAB — VITAMIN D 25 HYDROXY (VIT D DEFICIENCY, FRACTURES): Vit D, 25-Hydroxy: 48 ng/mL (ref 30–100)

## 2021-05-01 DIAGNOSIS — N401 Enlarged prostate with lower urinary tract symptoms: Secondary | ICD-10-CM | POA: Diagnosis not present

## 2021-05-01 DIAGNOSIS — R350 Frequency of micturition: Secondary | ICD-10-CM | POA: Diagnosis not present

## 2021-08-01 DIAGNOSIS — E1165 Type 2 diabetes mellitus with hyperglycemia: Secondary | ICD-10-CM | POA: Diagnosis not present

## 2021-08-01 DIAGNOSIS — I10 Essential (primary) hypertension: Secondary | ICD-10-CM | POA: Diagnosis not present

## 2021-08-01 DIAGNOSIS — K219 Gastro-esophageal reflux disease without esophagitis: Secondary | ICD-10-CM | POA: Diagnosis not present

## 2021-08-01 DIAGNOSIS — Z23 Encounter for immunization: Secondary | ICD-10-CM | POA: Diagnosis not present

## 2021-10-27 DIAGNOSIS — I10 Essential (primary) hypertension: Secondary | ICD-10-CM | POA: Diagnosis not present

## 2021-10-27 DIAGNOSIS — K219 Gastro-esophageal reflux disease without esophagitis: Secondary | ICD-10-CM | POA: Diagnosis not present

## 2021-10-27 DIAGNOSIS — E1165 Type 2 diabetes mellitus with hyperglycemia: Secondary | ICD-10-CM | POA: Diagnosis not present

## 2021-11-04 DIAGNOSIS — I10 Essential (primary) hypertension: Secondary | ICD-10-CM | POA: Diagnosis not present

## 2021-11-04 DIAGNOSIS — K219 Gastro-esophageal reflux disease without esophagitis: Secondary | ICD-10-CM | POA: Diagnosis not present

## 2021-11-04 DIAGNOSIS — E1165 Type 2 diabetes mellitus with hyperglycemia: Secondary | ICD-10-CM | POA: Diagnosis not present

## 2021-11-24 DIAGNOSIS — J302 Other seasonal allergic rhinitis: Secondary | ICD-10-CM | POA: Diagnosis not present

## 2021-11-24 DIAGNOSIS — K219 Gastro-esophageal reflux disease without esophagitis: Secondary | ICD-10-CM | POA: Diagnosis not present

## 2021-11-24 DIAGNOSIS — I10 Essential (primary) hypertension: Secondary | ICD-10-CM | POA: Diagnosis not present

## 2021-11-24 DIAGNOSIS — E1165 Type 2 diabetes mellitus with hyperglycemia: Secondary | ICD-10-CM | POA: Diagnosis not present

## 2022-02-03 DIAGNOSIS — E1165 Type 2 diabetes mellitus with hyperglycemia: Secondary | ICD-10-CM | POA: Diagnosis not present

## 2022-02-03 DIAGNOSIS — I10 Essential (primary) hypertension: Secondary | ICD-10-CM | POA: Diagnosis not present

## 2022-02-03 DIAGNOSIS — J302 Other seasonal allergic rhinitis: Secondary | ICD-10-CM | POA: Diagnosis not present

## 2022-02-03 DIAGNOSIS — M609 Myositis, unspecified: Secondary | ICD-10-CM | POA: Diagnosis not present

## 2022-02-09 DIAGNOSIS — Z418 Encounter for other procedures for purposes other than remedying health state: Secondary | ICD-10-CM | POA: Diagnosis not present

## 2022-02-09 DIAGNOSIS — K219 Gastro-esophageal reflux disease without esophagitis: Secondary | ICD-10-CM | POA: Diagnosis not present

## 2022-02-09 DIAGNOSIS — E1165 Type 2 diabetes mellitus with hyperglycemia: Secondary | ICD-10-CM | POA: Diagnosis not present

## 2022-02-09 DIAGNOSIS — I10 Essential (primary) hypertension: Secondary | ICD-10-CM | POA: Diagnosis not present

## 2022-03-23 DIAGNOSIS — J302 Other seasonal allergic rhinitis: Secondary | ICD-10-CM | POA: Diagnosis not present

## 2022-03-23 DIAGNOSIS — E1165 Type 2 diabetes mellitus with hyperglycemia: Secondary | ICD-10-CM | POA: Diagnosis not present

## 2022-03-23 DIAGNOSIS — M179 Osteoarthritis of knee, unspecified: Secondary | ICD-10-CM | POA: Diagnosis not present

## 2022-03-23 DIAGNOSIS — I10 Essential (primary) hypertension: Secondary | ICD-10-CM | POA: Diagnosis not present

## 2022-05-08 DIAGNOSIS — E1165 Type 2 diabetes mellitus with hyperglycemia: Secondary | ICD-10-CM | POA: Diagnosis not present

## 2022-05-08 DIAGNOSIS — I10 Essential (primary) hypertension: Secondary | ICD-10-CM | POA: Diagnosis not present

## 2022-05-08 DIAGNOSIS — E559 Vitamin D deficiency, unspecified: Secondary | ICD-10-CM | POA: Diagnosis not present

## 2022-05-08 DIAGNOSIS — Z0000 Encounter for general adult medical examination without abnormal findings: Secondary | ICD-10-CM | POA: Diagnosis not present

## 2022-05-08 DIAGNOSIS — K219 Gastro-esophageal reflux disease without esophagitis: Secondary | ICD-10-CM | POA: Diagnosis not present

## 2022-05-08 DIAGNOSIS — Z125 Encounter for screening for malignant neoplasm of prostate: Secondary | ICD-10-CM | POA: Diagnosis not present

## 2022-06-26 DIAGNOSIS — K219 Gastro-esophageal reflux disease without esophagitis: Secondary | ICD-10-CM | POA: Diagnosis not present

## 2022-06-26 DIAGNOSIS — I10 Essential (primary) hypertension: Secondary | ICD-10-CM | POA: Diagnosis not present

## 2022-06-26 DIAGNOSIS — E1165 Type 2 diabetes mellitus with hyperglycemia: Secondary | ICD-10-CM | POA: Diagnosis not present

## 2022-06-26 DIAGNOSIS — J302 Other seasonal allergic rhinitis: Secondary | ICD-10-CM | POA: Diagnosis not present

## 2022-08-14 ENCOUNTER — Encounter (INDEPENDENT_AMBULATORY_CARE_PROVIDER_SITE_OTHER): Payer: Self-pay | Admitting: Ophthalmology

## 2022-08-14 ENCOUNTER — Ambulatory Visit (INDEPENDENT_AMBULATORY_CARE_PROVIDER_SITE_OTHER): Payer: BC Managed Care – PPO | Admitting: Ophthalmology

## 2022-08-14 DIAGNOSIS — H35341 Macular cyst, hole, or pseudohole, right eye: Secondary | ICD-10-CM

## 2022-08-14 DIAGNOSIS — I10 Essential (primary) hypertension: Secondary | ICD-10-CM | POA: Diagnosis not present

## 2022-08-14 DIAGNOSIS — H33312 Horseshoe tear of retina without detachment, left eye: Secondary | ICD-10-CM | POA: Diagnosis not present

## 2022-08-14 DIAGNOSIS — H35033 Hypertensive retinopathy, bilateral: Secondary | ICD-10-CM

## 2022-08-14 DIAGNOSIS — H25812 Combined forms of age-related cataract, left eye: Secondary | ICD-10-CM

## 2022-08-14 DIAGNOSIS — Z961 Presence of intraocular lens: Secondary | ICD-10-CM

## 2022-08-14 DIAGNOSIS — Z8669 Personal history of other diseases of the nervous system and sense organs: Secondary | ICD-10-CM

## 2022-08-14 DIAGNOSIS — H04123 Dry eye syndrome of bilateral lacrimal glands: Secondary | ICD-10-CM

## 2022-08-14 DIAGNOSIS — E119 Type 2 diabetes mellitus without complications: Secondary | ICD-10-CM

## 2022-08-14 DIAGNOSIS — H35373 Puckering of macula, bilateral: Secondary | ICD-10-CM

## 2022-08-14 NOTE — Progress Notes (Signed)
[facility] Note  08/14/2022     CHIEF COMPLAINT Patient presents for Retina Evaluation   HISTORY OF PRESENT ILLNESS: Kenneth Perez is a 61 y.o. male who presents to the clinic today for:   HPI     Retina Evaluation   In left eye.  This started days ago.  Associated Symptoms Flashes and Floaters.  I, the attending physician,  performed the HPI with the patient and updated documentation appropriately.        Comments   Patient is here based on a referral from Dr. Rona Ravens for a RD in the left eye. Pt has a history of RD OD s/p repair x2.      Last edited by Bernarda Caffey, MD on 08/14/2022 10:48 AM.    Pt is here on the referral of Dr. Rona Ravens for concern of RD OD, pt states he went for a routine eye exam, not bc he was having a problem with his vision, pt has hx of RD OD repair x2, he states the first time it happened was in the [place] in 1998, and the first sx did not work, he states he had a second sx about a month later in [place], [place], pt states his eyes get very dry and watery, pt has had cataract sx OD since moving to the [place] in 2003, pt states he has been seeing fol for 2-3 years   Referring physician: [place], [place],  [place] [id number]  HISTORICAL INFORMATION:   Selected notes from the [place] Referred by Dr. Rona Ravens for concern of RD OD  LEE:  Ocular Hx- PMH-    CURRENT MEDICATIONS: No current outpatient medications on file. (Ophthalmic Drugs)   No current facility-administered medications for this visit. (Ophthalmic Drugs)   Current Outpatient Medications (Other)  Medication Sig   celecoxib (CELEBREX) 200 MG capsule Take 200 mg by mouth daily.   ibuprofen (ADVIL,MOTRIN) 200 MG tablet Take 200 mg by mouth every 6 (six) hours as needed for mild pain.   losartan-hydrochlorothiazide (HYZAAR) 100-12.5 MG tablet Take 1 tablet by mouth daily.   methocarbamol  (ROBAXIN) 500 MG tablet Take 1 tablet (500 mg total) by mouth 2 (two) times daily.   naproxen (NAPROSYN) 500 MG tablet Take 500 mg by mouth 2 (two) times daily.   omeprazole (PRILOSEC) 20 MG capsule Take 20 mg by mouth 2 (two) times daily.   tamsulosin (FLOMAX) 0.4 MG CAPS capsule Take 0.4 mg by mouth daily.   Vitamin D, Ergocalciferol, (DRISDOL) 1.25 MG (50000 UNIT) CAPS capsule Take 50,000 Units by mouth once a week.   No current facility-administered medications for this visit. (Other)   REVIEW OF SYSTEMS: ROS   Positive for: Eyes Last edited by Annie Paras, COT on 08/14/2022 10:38 AM.     ALLERGIES No Known Allergies  PAST MEDICAL HISTORY History reviewed. No pertinent past medical history. History reviewed. No pertinent surgical history.  FAMILY HISTORY History reviewed. No pertinent family history.  SOCIAL HISTORY Social History   Tobacco Use   Smoking status: Never   Smokeless tobacco: Never  Substance Use Topics   Alcohol use: No       OPHTHALMIC EXAM:  Base Eye Exam     Visual Acuity (Snellen - Linear)       Right Left   Dist [place] 20/200 20/30   Dist ph [place] NI NI  Tonometry (Tonopen, 10:42 AM)       Right Left   Pressure 15 15         Pupils       Dark Light Shape React APD   Right 3 2 Round Minimal None   Left 3 2 Round Minimal None         Visual Fields       Left Right    Full    Restrictions  Partial outer inferior temporal, superior nasal, inferior nasal deficiencies         Extraocular Movement       Right Left    Full, Ortho Full, Ortho         Neuro/Psych     Oriented x3: Yes         Dilation     Both eyes: 2.5% Phenylephrine, 1.0% Mydriacyl @ 10:38 AM           Slit Lamp and Fundus Exam     Slit Lamp Exam       Right Left   Lids/Lashes Dermatochalasis - upper lid Dermatochalasis - upper lid, mild MGD   Conjunctiva/Sclera Melanosis Melanosis, nasal and temporal pinguecula   Cornea  mild arcus, 2-3+ fine Punctate epithelial erosions, decreased TBUT, focal haze superior midzone mild arcus, 3+ fine Punctate epithelial erosions inferiorly, decreased TBUT   Anterior Chamber deep and clear deep and clear   Iris Round and moderately dilated to 4.19mm Round and well dilated   Lens PC IOL in good position with open PC (small opening) 2-3+ Nuclear sclerosis, 2-3+ Cortical cataract   Anterior Vitreous clear Vitreous syneresis, no pigment         Fundus Exam       Right Left   Disc mild Pallor, Sharp rim, +cupping, +PPP trace Pallor, Sharp rim, +cupping, +PPP   C/D Ratio 0.7 0.6   Macula Flat, macular hole with mild cystic changes surrounding, RPE mottling, no heme Flat, Good foveal reflex, RPE mottling, No heme or edema   Vessels attenuated, Tortuous mild attenuation, mild tortuosity   Periphery Attached, scattered pigmented CR scarring Attached, 3 small retinal holes at 0100, 3 large connected tears from 0130-0400; focal lattice degeneration at 1030           IMAGING AND PROCEDURES  Imaging and Procedures for 08/14/2022  OCT, Retina - OU - Both Eyes       Right Eye Quality was good. Central Foveal Thickness: 464. Progression has no prior data. Findings include abnormal foveal contour, subretinal hyper-reflective material, epiretinal membrane, intraretinal fluid (FTMH with +IRF/SRF, trace ERM).   Left Eye Quality was good. Central Foveal Thickness: 390. Progression has no prior data. Findings include no IRF, no SRF, abnormal foveal contour, epiretinal membrane, lamellar hole, macular pucker.   Notes *Images captured and stored on drive  Diagnosis / Impression:  OD: FTMH with +IRF/SRF, trace ERM OS: ERM w/ pucker and lamellar hole; no IRF/SRF centrally  Clinical management:  See below  Abbreviations: NFP - Normal foveal profile. CME - cystoid macular edema. PED - pigment epithelial detachment. IRF - intraretinal fluid. SRF - subretinal fluid. EZ - ellipsoid  zone. ERM - epiretinal membrane. ORA - outer retinal atrophy. ORT - outer retinal tubulation. SRHM - subretinal hyper-reflective material. IRHM - intraretinal hyper-reflective material      Color Fundus Photography Optos - OU - Both Eyes       Right Eye Progression has no prior data. Disc findings include normal observations (+PPP). Macula :  macular hole. Vessels : normal observations. Periphery : RPE abnormality (Extensive peripheral pigmented CR scarring ).   Left Eye Progression has no prior data. Disc findings include normal observations. Macula : epiretinal membrane (Pseudo hole). Vessels : tortuous vessels. Periphery : (Limited view).   Notes **Images stored on drive**  Impression: OD: Extensive peripheral pigmented CR scarring; macular hole OS: mild ERM w/ lamellar hole and pucker; limited peripheral visualization      Repair Retinal Breaks, Laser - OS - Left Eye       LASER PROCEDURE NOTE  Procedure:  Barrier laser retinopexy using slit lamp laser, LEFT eye   Diagnosis:   Retinal holes, tears, and lattice degeneration, LEFT eye                     3 contiguous tears from 130-400; 3 small holes at 0100; focal lattice degeneration at 1030   Surgeon: Bernarda Caffey, MD, PhD  Anesthesia: Topical  Informed consent obtained, operative eye marked, and time out performed prior to initiation of laser.   Laser settings:  Lumenis [id number] laser, slit lamp Lens: [place] PRP 165 Power: 320 mW Spot size: 200 microns Duration: 40 msec  # spots: 916  Placement of laser: Using a [place]ster PRP 165 contact lens at the slit lamp, laser was placed in three confluent rows posterior and around continguous tears from 0130-0400 oclock; retinal holes at 0100, and lattice degeneration at 1030-- all anterior to equator.  Complications: None.  Patient tolerated the procedure well and received written and verbal post-procedure care information/education.             ASSESSMENT/PLAN:    ICD-10-CM   1. Retinal tear of left eye  H33.312 Color Fundus Photography Optos - OU - Both Eyes    Repair Retinal Breaks, Laser - OS - Left Eye    2. History of retinal detachment  Z86.69     3. Macular hole of right eye  H35.341 OCT, Retina - OU - Both Eyes    Color Fundus Photography Optos - OU - Both Eyes    4. Epiretinal membrane (ERM) of both eyes  H35.373 OCT, Retina - OU - Both Eyes    Color Fundus Photography Optos - OU - Both Eyes    5. Diabetes mellitus type 2 without retinopathy ([place])  E11.9     6. Essential hypertension  I10     7. Hypertensive retinopathy of both eyes  H35.033     8. Combined forms of age-related cataract of left eye  H25.812     9. Pseudophakia  Z96.1     10. Dry eyes  H04.123      Retinal tears, OS  - The incidence, risk factors, and natural history of retinal tear was discussed with patient.   - Potential treatment options including laser retinopexy and cryotherapy discussed with patient. - three small holes at 0100, three large retinal tears from 0130-0400 - recommend laser retinopexy OS today, 10.27.23 - pt wishes to proceed with laser - RBA of procedure discussed, questions answered - informed consent obtained and signed - see procedure note -- focal lattice degeneration at 1030 also treated - start Lotemax QID x7 days - f/u in Tuesday, October 31, DFE, OCT  2. History of retinal detachment OD  - 1998 -- s/p repair in [place], then pt flew to [place] and underwent more definitive repair OD  - retina attached OD  - stable  3. Macular hole, right eye  -  The natural history, anatomy, potential for loss of vision, and treatment options including vitrectomy techniques and the complications of endophthalmitis, retinal detachment, vitreous hemorrhage, cataract progression and permanent vision loss discussed with the patient. - BCVA 20/200 - OCT shows full thickness macular hole with mild cystic changes surrounding -  recommend PPV w/ membrane peel and gas OD under general anesthesia once OS stable  4. ERM OU - The natural history, anatomy, potential for loss of vision, and treatment options including vitrectomy techniques and the complications of endophthalmitis, retinal detachment, vitreous hemorrhage, cataract progression and permanent vision loss discussed with the patient. - mild ERM OD; OS w/ lamellar hole and pucker - BCVA OD 20/200 (mostly from macular hole); OS 20/30 - asymptomatic, no metamorphopsia - no indication for surgery at this time - monitor for now  5. Diabetes mellitus, type 2 without retinopathy  - diagnosed with DM2 in 2022 - The incidence, risk factors for progression, natural history and treatment options for diabetic retinopathy  were discussed with patient.   - The need for close monitoring of blood glucose, blood pressure, and serum lipids, avoiding cigarette or any type of tobacco, and the need for long term follow up was also discussed with patient. - f/u in 1 year, sooner prn  6,7. Hypertensive retinopathy OU - discussed importance of tight BP control - monitor  8. Mixed Cataract OS - The symptoms of cataract, surgical options, and treatments and risks were discussed with patient. - discussed diagnosis and progression - monitor for now  9. Pseudophakia OD  - s/p CE/IOL OD  - IOL in good position, doing well  - monitor  10. Dry Eyes OU - recommend artificial tears and lubricating ointment as needed  Ophthalmic Meds Ordered this visit:  No orders of the defined types were placed in this encounter.    Return in about 4 days (around 08/18/2022) for f/u retinal tear OS, DFE, OCT.  There are no Patient Instructions on file for this visit.   Explained the diagnoses, plan, and follow up with the patient and they expressed understanding.  Patient expressed understanding of the importance of proper follow up care.   This document serves as a record of services  personally performed by Gardiner Sleeper, MD, PhD. It was created on their behalf by San Jetty. Owens Shark, OA an ophthalmic technician. The creation of this record is the provider's dictation and/or activities during the visit.    Electronically signed by: San Jetty. Owens Shark, [place] 10.27.2023 12:41 PM   Gardiner Sleeper, M.D., Ph.D. Diseases & Surgery of the Retina and Vitreous [facility]  I have reviewed the above documentation for accuracy and completeness, and I agree with the above. Gardiner Sleeper, M.D., Ph.D. 08/14/22 12:41 PM  Abbreviations: M myopia (nearsighted); A astigmatism; H hyperopia (farsighted); P presbyopia; Mrx spectacle prescription;  CTL contact lenses; OD right eye; OS left eye; OU both eyes  XT exotropia; ET esotropia; PEK punctate epithelial keratitis; PEE punctate epithelial erosions; DES dry eye syndrome; MGD meibomian gland dysfunction; ATs artificial tears; PFAT's preservative free artificial tears; [place] nuclear sclerotic cataract; PSC posterior subcapsular cataract; ERM epi-retinal membrane; PVD posterior vitreous detachment; RD retinal detachment; DM diabetes mellitus; DR diabetic retinopathy; NPDR non-proliferative diabetic retinopathy; PDR proliferative diabetic retinopathy; CSME clinically significant macular edema; DME diabetic macular edema; dbh dot blot hemorrhages; CWS cotton wool spot; POAG primary open angle glaucoma; C/D cup-to-disc ratio; HVF humphrey visual field; GVF goldmann visual field; OCT optical coherence  tomography; IOP intraocular pressure; BRVO Branch retinal vein occlusion; CRVO central retinal vein occlusion; CRAO central retinal artery occlusion; BRAO branch retinal artery occlusion; RT retinal tear; SB scleral buckle; PPV pars plana vitrectomy; VH Vitreous hemorrhage; PRP panretinal laser photocoagulation; IVK intravitreal kenalog; VMT vitreomacular traction; MH Macular hole;  NVD neovascularization of the disc; NVE neovascularization  elsewhere; AREDS age related eye disease study; ARMD age related macular degeneration; POAG primary open angle glaucoma; EBMD epithelial/anterior basement membrane dystrophy; ACIOL anterior chamber intraocular lens; IOL intraocular lens; PCIOL posterior chamber intraocular lens; Phaco/IOL phacoemulsification with intraocular lens placement; [place] photorefractive keratectomy; LASIK laser assisted in situ keratomileusis; HTN hypertension; DM diabetes mellitus; COPD chronic obstructive pulmonary disease

## 2022-08-18 ENCOUNTER — Encounter (INDEPENDENT_AMBULATORY_CARE_PROVIDER_SITE_OTHER): Payer: Self-pay | Admitting: Ophthalmology

## 2022-08-18 ENCOUNTER — Ambulatory Visit (INDEPENDENT_AMBULATORY_CARE_PROVIDER_SITE_OTHER): Payer: BC Managed Care – PPO | Admitting: Ophthalmology

## 2022-08-18 DIAGNOSIS — H35033 Hypertensive retinopathy, bilateral: Secondary | ICD-10-CM

## 2022-08-18 DIAGNOSIS — Z8669 Personal history of other diseases of the nervous system and sense organs: Secondary | ICD-10-CM

## 2022-08-18 DIAGNOSIS — H35373 Puckering of macula, bilateral: Secondary | ICD-10-CM

## 2022-08-18 DIAGNOSIS — H33312 Horseshoe tear of retina without detachment, left eye: Secondary | ICD-10-CM

## 2022-08-18 DIAGNOSIS — I10 Essential (primary) hypertension: Secondary | ICD-10-CM | POA: Diagnosis not present

## 2022-08-18 DIAGNOSIS — H25812 Combined forms of age-related cataract, left eye: Secondary | ICD-10-CM

## 2022-08-18 DIAGNOSIS — E119 Type 2 diabetes mellitus without complications: Secondary | ICD-10-CM

## 2022-08-18 DIAGNOSIS — Z961 Presence of intraocular lens: Secondary | ICD-10-CM

## 2022-08-18 DIAGNOSIS — H35341 Macular cyst, hole, or pseudohole, right eye: Secondary | ICD-10-CM

## 2022-08-18 DIAGNOSIS — H04123 Dry eye syndrome of bilateral lacrimal glands: Secondary | ICD-10-CM

## 2022-08-18 NOTE — Progress Notes (Signed)
[facility] Note  08/18/2022     CHIEF COMPLAINT Patient presents for Retina Evaluation   HISTORY OF PRESENT ILLNESS: Kenneth Perez is a 61 y.o. male who presents to the clinic today for:   HPI     Retina Evaluation   In left eye.  This started 4 days ago.  Duration of 4.  Associated Symptoms Flashes and Floaters.  I, the attending physician,  performed the HPI with the patient and updated documentation appropriately.        Comments   Retina follow up retina tear OS pt is reporting he is still seeing black spots and some occasional flashes he thinks his vision has improved       Last edited by Bernarda Caffey, MD on 08/19/2022 12:00 AM.      Referring physician: Nolene Ebbs, MD [place],  [place] [address]  HISTORICAL INFORMATION:   Selected notes from the MEDICAL RECORD NUMBER Referred by Dr. Rona Ravens for concern of RD OD  LEE:  Ocular Hx- PMH-    CURRENT MEDICATIONS: No current outpatient medications on file. (Ophthalmic Drugs)   No current facility-administered medications for this visit. (Ophthalmic Drugs)   Current Outpatient Medications (Other)  Medication Sig   celecoxib (CELEBREX) 200 MG capsule Take 200 mg by mouth daily.   ibuprofen (ADVIL,MOTRIN) 200 MG tablet Take 200 mg by mouth every 6 (six) hours as needed for mild pain.   losartan-hydrochlorothiazide (HYZAAR) 100-12.5 MG tablet Take 1 tablet by mouth daily.   methocarbamol (ROBAXIN) 500 MG tablet Take 1 tablet (500 mg total) by mouth 2 (two) times daily.   naproxen (NAPROSYN) 500 MG tablet Take 500 mg by mouth 2 (two) times daily.   omeprazole (PRILOSEC) 20 MG capsule Take 20 mg by mouth 2 (two) times daily.   tamsulosin (FLOMAX) 0.4 MG CAPS capsule Take 0.4 mg by mouth daily.   Vitamin D, Ergocalciferol, (DRISDOL) 1.25 MG (50000 UNIT) CAPS capsule Take 50,000 Units by mouth once a week.   No current facility-administered medications for this visit.  (Other)   REVIEW OF SYSTEMS:   ALLERGIES No Known Allergies  PAST MEDICAL HISTORY History reviewed. No pertinent past medical history. History reviewed. No pertinent surgical history.  FAMILY HISTORY History reviewed. No pertinent family history.  SOCIAL HISTORY Social History   Tobacco Use   Smoking status: Never   Smokeless tobacco: Never  Substance Use Topics   Alcohol use: No       OPHTHALMIC EXAM:  Base Eye Exam     Visual Acuity (Snellen - Linear)       Right Left   Dist [place] 20/40 20/30   Dist ph  NI NI         Tonometry (Tonopen, 1:07 PM)       Right Left   Pressure 16 16         Pupils       Dark Light Shape React APD   Right 3 2 Round Brisk None   Left 3 2 Round Brisk None         Visual Fields       Left Right    Full          Extraocular Movement       Right Left    Full Full         Neuro/Psych     Oriented x3: Yes         Dilation  2.5% Phenylephrine @ 1:07 PM           Slit Lamp and Fundus Exam     Slit Lamp Exam       Right Left   Lids/Lashes Dermatochalasis - upper lid Dermatochalasis - upper lid, mild MGD   Conjunctiva/Sclera Melanosis Melanosis, nasal and temporal pinguecula   Cornea mild arcus, 2-3+ fine Punctate epithelial erosions, decreased TBUT, focal haze superior midzone mild arcus, 3+ fine Punctate epithelial erosions inferiorly, decreased TBUT   Anterior Chamber deep and clear deep and clear   Iris Round and moderately dilated to 4.70mm Round and well dilated   Lens PC IOL in good position with open PC (small opening) 2-3+ Nuclear sclerosis, 2-3+ Cortical cataract   Anterior Vitreous clear Vitreous syneresis, no pigment         Fundus Exam       Right Left   Disc mild Pallor, Sharp rim, +cupping, +PPP trace Pallor, Sharp rim, +cupping, +PPP   C/D Ratio 0.7 0.6   Macula Flat, macular hole with mild cystic changes surrounding, RPE mottling, no heme Flat, ERM with lamellar pseudo  hole, RPE mottling, No heme   Vessels attenuated, Tortuous mild attenuation, mild tortuosity   Periphery Attached, scattered pigmented CR scarring Attached, 3 small retinal holes at 0100, 3 large connected tears from 0130-0400; focal lattice degeneration at 1030 -- good laser changes surrounding all lesions           IMAGING AND PROCEDURES  Imaging and Procedures for 08/18/2022  OCT, Retina - OU - Both Eyes       Right Eye Quality was good. Central Foveal Thickness: 351. Progression has improved. Findings include abnormal foveal contour, subretinal hyper-reflective material, epiretinal membrane, intraretinal fluid (FTMH with mild interval improvement in surrounding cystic changes).   Left Eye Quality was good. Central Foveal Thickness: 367. Progression has been stable. Findings include no IRF, no SRF, abnormal foveal contour, epiretinal membrane, lamellar hole, macular pucker.   Notes *Images captured and stored on drive  Diagnosis / Impression:  OD: FTMH with mild interval improvement in surrounding cystic changes OS: ERM w/ pucker and lamellar hole; no IRF/SRF centrally -- stable  Clinical management:  See below  Abbreviations: NFP - Normal foveal profile. CME - cystoid macular edema. PED - pigment epithelial detachment. IRF - intraretinal fluid. SRF - subretinal fluid. EZ - ellipsoid zone. ERM - epiretinal membrane. ORA - outer retinal atrophy. ORT - outer retinal tubulation. SRHM - subretinal hyper-reflective material. IRHM - intraretinal hyper-reflective material            ASSESSMENT/PLAN:    ICD-10-CM   1. Retinal tear of left eye  H33.312     2. History of retinal detachment  Z86.69     3. Macular hole of right eye  H35.341 OCT, Retina - OU - Both Eyes    4. Epiretinal membrane (ERM) of both eyes  H35.373     5. Diabetes mellitus type 2 without retinopathy ([place])  E11.9     6. Essential hypertension  I10     7. Hypertensive retinopathy of both eyes   H35.033     8. Combined forms of age-related cataract of left eye  H25.812     9. Pseudophakia  Z96.1     10. Dry eyes  H04.123      Retinal tears, OS  - three small holes at 0100, three large retinal tears from 0130-0400 - s/p laser retinopexy OS (10.27.23) -- good laser changes surrounding  all breaks - focal lattice degeneration at 1030 also treated - cont Lotemax QID OS - f/u 1 week, DFE, OCT  2. History of retinal detachment OD  - 1998 -- s/p repair in [place], then pt flew to [place] and underwent more definitive repair OD  - retina attached OD  - stable  3. Macular hole, right eye  - BCVA 20/40 from 20/200 - OCT shows full thickness macular hole with mild interval improvement in surrounding cystic changes - The natural history, anatomy, potential for loss of vision, and treatment options including vitrectomy techniques and the complications of endophthalmitis, retinal detachment, vitreous hemorrhage, cataract progression and permanent vision loss discussed with the patient. - recommend PPV w/ membrane peel and gas OD under general anesthesia - RBA of procedure discussed, questions answered - pt to discuss with family, but tentatively planning surgery on Sep 03, 2022 - f/u 1 week for DFE/OCT and surgical paperwork  4. ERM OU - mild ERM OD; OS w/ lamellar hole and pucker - BCVA OD 20/40 (from macular hole); OS 20/30 - asymptomatic, no metamorphopsia - monitor for now  5. Diabetes mellitus, type 2 without retinopathy  - diagnosed with DM2 in 2022 - The incidence, risk factors for progression, natural history and treatment options for diabetic retinopathy  were discussed with patient.   - The need for close monitoring of blood glucose, blood pressure, and serum lipids, avoiding cigarette or any type of tobacco, and the need for long term follow up was also discussed with patient. - f/u in 1 year, sooner prn  6,7. Hypertensive retinopathy OU - discussed importance of tight  BP control - monitor  8. Mixed Cataract OS - The symptoms of cataract, surgical options, and treatments and risks were discussed with patient. - discussed diagnosis and progression - monitor for now  9. Pseudophakia OD  - s/p CE/IOL OD  - IOL in good position, doing well  - monitor  10. Dry Eyes OU - recommend artificial tears and lubricating ointment as needed  Ophthalmic Meds Ordered this visit:  No orders of the defined types were placed in this encounter.    Return in about 1 week (around 08/25/2022) for f/u macular hole OD, DFE, OCT.  There are no Patient Instructions on file for this visit.   Explained the diagnoses, plan, and follow up with the patient and they expressed understanding.  Patient expressed understanding of the importance of proper follow up care.   This document serves as a record of services personally performed by Gardiner Sleeper, MD, PhD. It was created on their behalf by San Jetty. Owens Shark, OA an ophthalmic technician. The creation of this record is the provider's dictation and/or activities during the visit.    Electronically signed by: San Jetty. Owens Shark, [place] 10.31.2023 12:01 AM  Gardiner Sleeper, M.D., Ph.D. Diseases & Surgery of the Retina and Vitreous [facility]  I have reviewed the above documentation for accuracy and completeness, and I agree with the above. Gardiner Sleeper, M.D., Ph.D. 08/19/22 12:09 AM   Abbreviations: M myopia (nearsighted); A astigmatism; H hyperopia (farsighted); P presbyopia; Mrx spectacle prescription;  CTL contact lenses; OD right eye; OS left eye; OU both eyes  XT exotropia; ET esotropia; PEK punctate epithelial keratitis; PEE punctate epithelial erosions; DES dry eye syndrome; MGD meibomian gland dysfunction; ATs artificial tears; PFAT's preservative free artificial tears; [place] nuclear sclerotic cataract; PSC posterior subcapsular cataract; ERM epi-retinal membrane; PVD posterior vitreous detachment; RD  retinal  detachment; DM diabetes mellitus; DR diabetic retinopathy; NPDR non-proliferative diabetic retinopathy; PDR proliferative diabetic retinopathy; CSME clinically significant macular edema; DME diabetic macular edema; dbh dot blot hemorrhages; CWS cotton wool spot; POAG primary open angle glaucoma; C/D cup-to-disc ratio; HVF humphrey visual field; GVF goldmann visual field; OCT optical coherence tomography; IOP intraocular pressure; BRVO Branch retinal vein occlusion; CRVO central retinal vein occlusion; CRAO central retinal artery occlusion; BRAO branch retinal artery occlusion; RT retinal tear; SB scleral buckle; PPV pars plana vitrectomy; VH Vitreous hemorrhage; PRP panretinal laser photocoagulation; IVK intravitreal kenalog; VMT vitreomacular traction; MH Macular hole;  NVD neovascularization of the disc; NVE neovascularization elsewhere; AREDS age related eye disease study; ARMD age related macular degeneration; POAG primary open angle glaucoma; EBMD epithelial/anterior basement membrane dystrophy; ACIOL anterior chamber intraocular lens; IOL intraocular lens; PCIOL posterior chamber intraocular lens; Phaco/IOL phacoemulsification with intraocular lens placement; [place] photorefractive keratectomy; LASIK laser assisted in situ keratomileusis; HTN hypertension; DM diabetes mellitus; COPD chronic obstructive pulmonary disease

## 2022-08-19 NOTE — Progress Notes (Signed)
[facility] Note  08/25/2022     CHIEF COMPLAINT Patient presents for Retina Follow Up   HISTORY OF PRESENT ILLNESS: Kenneth Perez is a 61 y.o. male who presents to the clinic today for:   HPI     Retina Follow Up   Patient presents with  Other.  In right eye.  This started 1 week ago.  I, the attending physician,  performed the HPI with the patient and updated documentation appropriately.        Comments   Patient here for 1 weeks retina follow up for mac hole OD. Patient states vision doing good. Not much eye pain. Has little black floater. Not like before.       Last edited by Bernarda Caffey, MD on 08/25/2022 11:19 PM.     Referring physician: Nolene Ebbs, MD [place],  [place] [address]  HISTORICAL INFORMATION:   Selected notes from the MEDICAL RECORD NUMBER Referred by Dr. Rona Ravens for concern of RD OD  LEE:  Ocular Hx- PMH-    CURRENT MEDICATIONS: No current outpatient medications on file. (Ophthalmic Drugs)   No current facility-administered medications for this visit. (Ophthalmic Drugs)   Current Outpatient Medications (Other)  Medication Sig   celecoxib (CELEBREX) 200 MG capsule Take 200 mg by mouth daily.   ibuprofen (ADVIL,MOTRIN) 200 MG tablet Take 200 mg by mouth every 6 (six) hours as needed for mild pain.   losartan-hydrochlorothiazide (HYZAAR) 100-12.5 MG tablet Take 1 tablet by mouth daily.   methocarbamol (ROBAXIN) 500 MG tablet Take 1 tablet (500 mg total) by mouth 2 (two) times daily.   naproxen (NAPROSYN) 500 MG tablet Take 500 mg by mouth 2 (two) times daily.   omeprazole (PRILOSEC) 20 MG capsule Take 20 mg by mouth 2 (two) times daily.   tamsulosin (FLOMAX) 0.4 MG CAPS capsule Take 0.4 mg by mouth daily.   Vitamin D, Ergocalciferol, (DRISDOL) 1.25 MG (50000 UNIT) CAPS capsule Take 50,000 Units by mouth once a week.   No current facility-administered medications for this visit. (Other)   REVIEW  OF SYSTEMS: ROS   Positive for: Eyes Last edited by Theodore Demark, COA on 08/25/2022  2:54 PM.     ALLERGIES No Known Allergies  PAST MEDICAL HISTORY History reviewed. No pertinent past medical history. History reviewed. No pertinent surgical history.  FAMILY HISTORY History reviewed. No pertinent family history.  SOCIAL HISTORY Social History   Tobacco Use   Smoking status: Never   Smokeless tobacco: Never  Vaping Use   Vaping Use: Never used  Substance Use Topics   Alcohol use: No       OPHTHALMIC EXAM:  Base Eye Exam     Visual Acuity (Snellen - Linear)       Right Left   Dist [place] 20/100 -1 20/30 +1   Dist ph [place] 20/80 -2          Tonometry (Tonopen, 2:51 PM)       Right Left   Pressure 21 15         Pupils       Dark Light Shape React APD   Right 3 2 Round Minimal None   Left 3 2 Round Minimal None         Visual Fields (Counting fingers)       Left Right    Full    Restrictions  Partial outer inferior temporal, superior nasal, inferior nasal deficiencies  Extraocular Movement       Right Left    Full, Ortho Full, Ortho         Neuro/Psych     Oriented x3: Yes   Mood/Affect: Normal         Dilation     Both eyes: 1.0% Mydriacyl, 2.5% Phenylephrine @ 2:51 PM           Slit Lamp and Fundus Exam     Slit Lamp Exam       Right Left   Lids/Lashes Dermatochalasis - upper lid Dermatochalasis - upper lid, mild MGD   Conjunctiva/Sclera Melanosis Melanosis, nasal and temporal pinguecula   Cornea mild arcus, 2-3+ fine Punctate epithelial erosions, decreased TBUT, focal haze superior midzone mild arcus, 3+ fine Punctate epithelial erosions inferiorly, decreased TBUT   Anterior Chamber deep and clear deep and clear   Iris Round and moderately dilated to 4.55m Round and well dilated   Lens PC IOL in good position with open PC (small opening) 2-3+ Nuclear sclerosis, 2-3+ Cortical cataract   Anterior Vitreous clear  Vitreous syneresis, no pigment         Fundus Exam       Right Left   Disc mild Pallor, Sharp rim, +cupping, +PPP trace Pallor, Sharp rim, +cupping, +PPP   C/D Ratio 0.7 0.6   Macula Flat, macular hole with mild cystic changes surrounding, RPE mottling, no heme Flat, ERM with lamellar pseudo hole, RPE mottling, No heme   Vessels attenuated, Tortuous mild attenuation, mild tortuosity   Periphery Attached, scattered pigmented CR scarring Attached, 3 small retinal holes at 0100, 3 large connected tears from 0130-0400; focal lattice degeneration at 1030 -- good laser changes surrounding all lesions           Refraction     Manifest Refraction       Sphere Cylinder Axis Dist [place]   Right -1.25 +1.00 180 20/100   Left               IMAGING AND PROCEDURES  Imaging and Procedures for 08/25/2022  OCT, Retina - OU - Both Eyes       Right Eye Quality was good. Central Foveal Thickness: 328. Progression has been stable. Findings include abnormal foveal contour, subretinal hyper-reflective material, epiretinal membrane, intraretinal fluid (FTMH with mild interval improvement in surrounding cystic changes).   Left Eye Quality was good. Central Foveal Thickness: 416. Progression has been stable. Findings include no IRF, no SRF, abnormal foveal contour, epiretinal membrane, lamellar hole, macular pucker.   Notes *Images captured and stored on drive  Diagnosis / Impression:  OD: FTMH with mild interval improvement in surrounding cystic changes OS: ERM w/ pucker and lamellar hole; no IRF/SRF centrally -- stable  Clinical management:  See below  Abbreviations: NFP - Normal foveal profile. CME - cystoid macular edema. PED - pigment epithelial detachment. IRF - intraretinal fluid. SRF - subretinal fluid. EZ - ellipsoid zone. ERM - epiretinal membrane. ORA - outer retinal atrophy. ORT - outer retinal tubulation. SRHM - subretinal hyper-reflective material. IRHM - intraretinal  hyper-reflective material            ASSESSMENT/PLAN:    ICD-10-CM   1. Retinal tear of left eye  H33.312     2. History of retinal detachment  Z86.69     3. Macular hole of right eye  H35.341 OCT, Retina - OU - Both Eyes    4. Epiretinal membrane (ERM) of both eyes  H35.373  5. Diabetes mellitus type 2 without retinopathy ([place])  E11.9     6. Essential hypertension  I10     7. Hypertensive retinopathy of both eyes  H35.033     8. Combined forms of age-related cataract of left eye  H25.812     9. Pseudophakia  Z96.1     10. Dry eyes  H04.123       Retinal tears, OS  - three small holes at 0100, three large retinal tears from 0130-0400 - s/p laser retinopexy OS (10.27.23) -- good laser changes surrounding all breaks - focal lattice degeneration at 1030 also treated - cont Lotemax QID OS - f/u 1 week, DFE, OCT  2. History of retinal detachment OD  - 1998 -- s/p repair in [place], then pt flew to [place] and underwent more definitive repair OD  - retina attached OD  - stable  3. Macular hole, right eye  - BCVA 20/80 from 20/40 from 20/200 - OCT shows full thickness macular hole with mild interval improvement in surrounding cystic changes - The natural history, anatomy, potential for loss of vision, and treatment options including vitrectomy techniques and the complications of endophthalmitis, retinal detachment, vitreous hemorrhage, cataract progression and permanent vision loss discussed with the patient. - recommend PPV w/ membrane peel and gas OD under general anesthesia - pt wishes to proceed with surgery - RBA of procedure discussed, questions answered - informed consent obtained and signed - surgery scheduled for Thursday, September 03, 2022, 11:30am, [facility] OR 8 - f/u November 17 for POV  4. ERM OU - mild ERM OD; OS w/ lamellar hole and pucker - BCVA OD 20/80 (from macular hole); OS 20/30 - asymptomatic, no metamorphopsia - monitor for now  5. Diabetes  mellitus, type 2 without retinopathy  - diagnosed with DM2 in 2022 - The incidence, risk factors for progression, natural history and treatment options for diabetic retinopathy  were discussed with patient.   - The need for close monitoring of blood glucose, blood pressure, and serum lipids, avoiding cigarette or any type of tobacco, and the need for long term follow up was also discussed with patient. - f/u in 1 year, sooner prn  6,7. Hypertensive retinopathy OU - discussed importance of tight BP control - monitor  8. Mixed Cataract OS - The symptoms of cataract, surgical options, and treatments and risks were discussed with patient. - discussed diagnosis and progression - monitor for now  9. Pseudophakia OD  - s/p CE/IOL OD  - IOL in good position, doing well  - monitor  10. Dry Eyes OU - recommend artificial tears and lubricating ointment as needed  Ophthalmic Meds Ordered this visit:  No orders of the defined types were placed in this encounter.    Return in about 10 days (around 09/04/2022) for f/u mac hole OD, POV.  There are no Patient Instructions on file for this visit.   Explained the diagnoses, plan, and follow up with the patient and they expressed understanding.  Patient expressed understanding of the importance of proper follow up care.   This document serves as a record of services personally performed by Gardiner Sleeper, MD, PhD. It was created on their behalf by Renaldo Reel, [place] an ophthalmic technician. The creation of this record is the provider's dictation and/or activities during the visit.    Electronically signed by:  Renaldo Reel, COT  11.01.23 11:23 PM  This document serves as a record of services personally performed by Gardiner Sleeper, MD,  PhD. It was created on their behalf by San Jetty. Owens Shark, OA an ophthalmic technician. The creation of this record is the provider's dictation and/or activities during the visit.    Electronically signed  by: San Jetty. Marguerita Merles 11.07.2023 11:23 PM  Gardiner Sleeper, M.D., Ph.D. Diseases & Surgery of the Retina and Vitreous [facility]  I have reviewed the above documentation for accuracy and completeness, and I agree with the above. Gardiner Sleeper, M.D., Ph.D. 08/25/22 11:23 PM  Abbreviations: M myopia (nearsighted); A astigmatism; H hyperopia (farsighted); P presbyopia; Mrx spectacle prescription;  CTL contact lenses; OD right eye; OS left eye; OU both eyes  XT exotropia; ET esotropia; PEK punctate epithelial keratitis; PEE punctate epithelial erosions; DES dry eye syndrome; MGD meibomian gland dysfunction; ATs artificial tears; PFAT's preservative free artificial tears; [place] nuclear sclerotic cataract; PSC posterior subcapsular cataract; ERM epi-retinal membrane; PVD posterior vitreous detachment; RD retinal detachment; DM diabetes mellitus; DR diabetic retinopathy; NPDR non-proliferative diabetic retinopathy; PDR proliferative diabetic retinopathy; CSME clinically significant macular edema; DME diabetic macular edema; dbh dot blot hemorrhages; CWS cotton wool spot; POAG primary open angle glaucoma; C/D cup-to-disc ratio; HVF humphrey visual field; GVF goldmann visual field; OCT optical coherence tomography; IOP intraocular pressure; BRVO Branch retinal vein occlusion; CRVO central retinal vein occlusion; CRAO central retinal artery occlusion; BRAO branch retinal artery occlusion; RT retinal tear; SB scleral buckle; PPV pars plana vitrectomy; VH Vitreous hemorrhage; PRP panretinal laser photocoagulation; IVK intravitreal kenalog; VMT vitreomacular traction; MH Macular hole;  NVD neovascularization of the disc; NVE neovascularization elsewhere; AREDS age related eye disease study; ARMD age related macular degeneration; POAG primary open angle glaucoma; EBMD epithelial/anterior basement membrane dystrophy; ACIOL anterior chamber intraocular lens; IOL intraocular lens; PCIOL posterior  chamber intraocular lens; Phaco/IOL phacoemulsification with intraocular lens placement; [place] photorefractive keratectomy; LASIK laser assisted in situ keratomileusis; HTN hypertension; DM diabetes mellitus; COPD chronic obstructive pulmonary disease

## 2022-08-25 ENCOUNTER — Encounter (INDEPENDENT_AMBULATORY_CARE_PROVIDER_SITE_OTHER): Payer: Self-pay | Admitting: Ophthalmology

## 2022-08-25 ENCOUNTER — Ambulatory Visit (INDEPENDENT_AMBULATORY_CARE_PROVIDER_SITE_OTHER): Payer: BC Managed Care – PPO | Admitting: Ophthalmology

## 2022-08-25 DIAGNOSIS — I10 Essential (primary) hypertension: Secondary | ICD-10-CM | POA: Diagnosis not present

## 2022-08-25 DIAGNOSIS — H35033 Hypertensive retinopathy, bilateral: Secondary | ICD-10-CM

## 2022-08-25 DIAGNOSIS — Z8669 Personal history of other diseases of the nervous system and sense organs: Secondary | ICD-10-CM | POA: Diagnosis not present

## 2022-08-25 DIAGNOSIS — H35341 Macular cyst, hole, or pseudohole, right eye: Secondary | ICD-10-CM

## 2022-08-25 DIAGNOSIS — H35373 Puckering of macula, bilateral: Secondary | ICD-10-CM

## 2022-08-25 DIAGNOSIS — H04123 Dry eye syndrome of bilateral lacrimal glands: Secondary | ICD-10-CM

## 2022-08-25 DIAGNOSIS — Z961 Presence of intraocular lens: Secondary | ICD-10-CM

## 2022-08-25 DIAGNOSIS — E119 Type 2 diabetes mellitus without complications: Secondary | ICD-10-CM

## 2022-08-25 DIAGNOSIS — H33312 Horseshoe tear of retina without detachment, left eye: Secondary | ICD-10-CM | POA: Diagnosis not present

## 2022-08-25 DIAGNOSIS — H25812 Combined forms of age-related cataract, left eye: Secondary | ICD-10-CM

## 2022-08-31 NOTE — Progress Notes (Signed)
[facility] Note  09/04/2022     CHIEF COMPLAINT Patient presents for Retina Follow Up   HISTORY OF PRESENT ILLNESS: Kenneth Perez is a 61 y.o. male who presents to the clinic today for:   HPI     Retina Follow Up   Patient presents with  Other.  In right eye.  Severity is moderate.  Duration of 1 day.  Since onset it is stable.  I, the attending physician,  performed the HPI with the patient and updated documentation appropriately.        Comments   Pt here for 1 day POV s/p PPV C3F8 OS. Pt states       Last edited by Bernarda Caffey, MD on 09/04/2022  8:36 PM.    Pt states last night went well  Referring physician: Nolene Ebbs, MD [place],  [place] [address]  HISTORICAL INFORMATION:   Selected notes from the MEDICAL RECORD NUMBER Referred by Dr. Rona Ravens for concern of RD OD  LEE:  Ocular Hx- PMH-    CURRENT MEDICATIONS: No current outpatient medications on file. (Ophthalmic Drugs)   No current facility-administered medications for this visit. (Ophthalmic Drugs)   Current Outpatient Medications (Other)  Medication Sig   glimepiride (AMARYL) 4 MG tablet Take 4 mg by mouth daily.   ibuprofen (ADVIL,MOTRIN) 200 MG tablet Take 200 mg by mouth every 6 (six) hours as needed for mild pain.   losartan-hydrochlorothiazide (HYZAAR) 100-12.5 MG tablet Take 1 tablet by mouth daily.   metFORMIN (GLUCOPHAGE) 500 MG tablet Take 500 mg by mouth 2 (two) times daily.   omeprazole (PRILOSEC) 20 MG capsule Take 20 mg by mouth 2 (two) times daily.   Vitamin D, Ergocalciferol, (DRISDOL) 1.25 MG (50000 UNIT) CAPS capsule Take 50,000 Units by mouth every Thursday.   No current facility-administered medications for this visit. (Other)   REVIEW OF SYSTEMS: ROS   Positive for: Eyes Last edited by Bernarda Caffey, MD on 09/04/2022  8:36 PM.     ALLERGIES No Known Allergies  PAST MEDICAL HISTORY Past Medical History:  Diagnosis Date    Diabetes mellitus without complication ([place])    GERD (gastroesophageal reflux disease)    Hypertension    Past Surgical History:  Procedure Laterality Date   GAS INSERTION Right 09/03/2022   Procedure: INSERTION OF GAS;  Surgeon: Bernarda Caffey, MD;  Location: [facility];  Service: Ophthalmology;  Laterality: Right;   GAS/FLUID EXCHANGE Right 09/03/2022   Procedure: GAS/FLUID EXCHANGE;  Surgeon: Bernarda Caffey, MD;  Location: [facility];  Service: Ophthalmology;  Laterality: Right;   LASER PHOTO ABLATION Right 09/03/2022   Procedure: LASER PHOTO ABLATION;  Surgeon: Bernarda Caffey, MD;  Location: [facility];  Service: Ophthalmology;  Laterality: Right;   MEMBRANE PEEL Right 09/03/2022   Procedure: MEMBRANE PEEL;  Surgeon: Bernarda Caffey, MD;  Location: [facility];  Service: Ophthalmology;  Laterality: Right;   PARS PLANA VITRECTOMY Right 09/03/2022   Procedure: PARS PLANA VITRECTOMY WITH 25 GAUGE;  Surgeon: Bernarda Caffey, MD;  Location: [facility];  Service: Ophthalmology;  Laterality: Right;   FAMILY HISTORY History reviewed. No pertinent family history.  SOCIAL HISTORY Social History   Tobacco Use   Smoking status: Never   Smokeless tobacco: Never  Vaping Use   Vaping Use: Never used  Substance Use Topics   Alcohol use: Never   Drug use: Never       OPHTHALMIC EXAM:  Base Eye Exam     Visual  Acuity (Snellen - Linear)       Right Left   Dist [place] CF at 2' 20/25   Dist ph [place] NI          Tonometry (Tonopen, 9:20 AM)       Right Left   Pressure 16 17         Pupils       Dark Light Shape React APD   Right Dilated  Round NR 0   Left 3 2 Round Brisk None         Visual Fields (Counting fingers)       Left Right    Full    Restrictions  Partial outer superior temporal, inferior temporal, superior nasal, inferior nasal deficiencies         Extraocular Movement       Right Left    Full, Ortho Full, Ortho         Neuro/Psych     Oriented x3: Yes   Mood/Affect: Normal          Dilation     Both eyes: 1.0% Mydriacyl, 2.5% Phenylephrine @ 9:21 AM           Slit Lamp and Fundus Exam     Slit Lamp Exam       Right Left   Lids/Lashes Dermatochalasis - upper lid Dermatochalasis - upper lid, mild MGD   Conjunctiva/Sclera Melanosis, Subconjunctival hemorrhage, hemorrhagic Chemosis inferiorly, sutures intact Melanosis, nasal and temporal pinguecula   Cornea mild arcus, well healed cataract wound, endo pigment mild arcus, 3+ fine Punctate epithelial erosions inferiorly, decreased TBUT   Anterior Chamber deep, clear, narrow temporal angle deep and clear   Iris Round and moderately dilated to 4.55m Round and well dilated   Lens PC IOL in good position with open PC (small opening) 2-3+ Nuclear sclerosis, 2-3+ Cortical cataract   Anterior Vitreous post vitrectomy, good gas fill Vitreous syneresis, no pigment         Fundus Exam       Right Left   Disc mild Pallor, Sharp rim, +cupping, +PPP trace Pallor, Sharp rim, +cupping, +PPP   C/D Ratio 0.7 0.6   Macula flat under gas; Mac hole closing Flat, ERM with lamellar pseudo hole, RPE mottling, No heme   Vessels attenuated, Tortuous mild attenuation, mild tortuosity   Periphery Attached, scattered pigmented CR scarring, supplemental laser changes posterior to peripheral CR scars Attached, 3 small retinal holes at 0100, 3 large connected tears from 0130-0400; focal lattice degeneration at 1030 -- good laser changes surrounding all lesions           IMAGING AND PROCEDURES  Imaging and Procedures for 09/04/2022          ASSESSMENT/PLAN:    ICD-10-CM   1. Macular hole of right eye  H35.341     2. Retinal tear of left eye  H33.312     3. History of retinal detachment  Z86.69     4. Epiretinal membrane (ERM) of both eyes  H35.373     5. Diabetes mellitus type 2 without retinopathy [place]  E11.9     6. Essential hypertension  I10     7. Hypertensive retinopathy of both eyes  H35.033     8.  Combined forms of age-related cataract of left eye  H25.812     9. Pseudophakia  Z96.1     10. Dry eyes  H04.123      1. Macular hole, right eye  -  OCT shows full thickness macular hole with mild interval improvement in surrounding cystic changes - pre-op BCVA 20/80 from 20/40 from 20/200 - POD1 s/p PPV/TissueBlue/MP/14% C3F8 OS, 11.16.23             - doing well this morning             - retina attached and good gas bubble in place w/ mac hole closing             - IOP 16              - start PF 6x/day OS                          zymaxid QID OS                         atropine BID OD                         PSO ung QID OS              - cont face down positioning x3 days then can decrease positioning to 50% of time; avoid laying flat on back              - eye shield when sleeping              - post op drop and positioning instructions reviewed              - tylenol/ibuprofen for pain  - next Weds, POV  2. Retinal tears, OS  - three small holes at 0100, three large retinal tears from 0130-0400 - s/p laser retinopexy OS (10.27.23) -- good laser changes surrounding all breaks - focal lattice degeneration at 1030 also treated - cont Lotemax QID OS - f/u 1 week, DFE, OCT  3. History of retinal detachment OD  - 1998 -- s/p repair in [place], then pt flew to [place] and underwent more definitive repair OD  - retina attached OD  - stable  4. ERM OU - mild ERM OD; OS w/ lamellar hole and pucker - BCVA OD 20/80 (from macular hole); OS 20/30 - asymptomatic, no metamorphopsia - monitor for now  5. Diabetes mellitus, type 2 without retinopathy  - diagnosed with DM2 in 2022 - The incidence, risk factors for progression, natural history and treatment options for diabetic retinopathy  were discussed with patient.   - The need for close monitoring of blood glucose, blood pressure, and serum lipids, avoiding cigarette or any type of tobacco, and the need for long term follow up was also  discussed with patient. - f/u in 1 year, sooner prn  6,7. Hypertensive retinopathy OU - discussed importance of tight BP control - monitor  8. Mixed Cataract OS - The symptoms of cataract, surgical options, and treatments and risks were discussed with patient. - discussed diagnosis and progression - monitor for now  9. Pseudophakia OD  - s/p CE/IOL OD  - IOL in good position, doing well  - monitor  10. Dry Eyes OU - recommend artificial tears and lubricating ointment as needed  Ophthalmic Meds Ordered this visit:  No orders of the defined types were placed in this encounter.    Return in about 5 days (around 09/09/2022) for f/u POV week 1, FTMH OD.  There are no Patient Instructions on file for this visit.  Explained the diagnoses, plan, and follow up with the patient and they expressed understanding.  Patient expressed understanding of the importance of proper follow up care.   This document serves as a record of services personally performed by Gardiner Sleeper, MD, PhD. It was created on their behalf by San Jetty. Owens Shark, OA an ophthalmic technician. The creation of this record is the provider's dictation and/or activities during the visit.    Electronically signed by: San Jetty. Owens Shark, [place] 11.13.2023 8:36 PM  Gardiner Sleeper, M.D., Ph.D. Diseases & Surgery of the Retina and Vitreous [facility]  I have reviewed the above documentation for accuracy and completeness, and I agree with the above. Gardiner Sleeper, M.D., Ph.D. 09/04/22 8:40 PM   Abbreviations: M myopia (nearsighted); A astigmatism; H hyperopia (farsighted); P presbyopia; Mrx spectacle prescription;  CTL contact lenses; OD right eye; OS left eye; OU both eyes  XT exotropia; ET esotropia; PEK punctate epithelial keratitis; PEE punctate epithelial erosions; DES dry eye syndrome; MGD meibomian gland dysfunction; ATs artificial tears; PFAT's preservative free artificial tears; [place] nuclear  sclerotic cataract; PSC posterior subcapsular cataract; ERM epi-retinal membrane; PVD posterior vitreous detachment; RD retinal detachment; DM diabetes mellitus; DR diabetic retinopathy; NPDR non-proliferative diabetic retinopathy; PDR proliferative diabetic retinopathy; CSME clinically significant macular edema; DME diabetic macular edema; dbh dot blot hemorrhages; CWS cotton wool spot; POAG primary open angle glaucoma; C/D cup-to-disc ratio; HVF humphrey visual field; GVF goldmann visual field; OCT optical coherence tomography; IOP intraocular pressure; BRVO Branch retinal vein occlusion; CRVO central retinal vein occlusion; CRAO central retinal artery occlusion; BRAO branch retinal artery occlusion; RT retinal tear; SB scleral buckle; PPV pars plana vitrectomy; VH Vitreous hemorrhage; PRP panretinal laser photocoagulation; IVK intravitreal kenalog; VMT vitreomacular traction; MH Macular hole;  NVD neovascularization of the disc; NVE neovascularization elsewhere; AREDS age related eye disease study; ARMD age related macular degeneration; POAG primary open angle glaucoma; EBMD epithelial/anterior basement membrane dystrophy; ACIOL anterior chamber intraocular lens; IOL intraocular lens; PCIOL posterior chamber intraocular lens; Phaco/IOL phacoemulsification with intraocular lens placement; [place] photorefractive keratectomy; LASIK laser assisted in situ keratomileusis; HTN hypertension; DM diabetes mellitus; COPD chronic obstructive pulmonary disease

## 2022-09-01 NOTE — H&P (Signed)
Kenneth Perez is an 61 y.o. male.    Chief Complaint: macular hole OD  HPI: Pt with prior ophthalmic history significant for retinal tears OS and retinal detachment OD. Pt was initially referred for evaluation of retinal tears OS and on examination, was found to also have a full thickness macular hole OD and corresponding decrease in vision OD. After a discussion of the risks benefits and alternative to surgery, the patient has elected to proceed with surgical repair of the macular hole OD -- 25g PPV w/ membrane peel and gas OD under general anesthesia.  No past medical history on file.  No past surgical history on file.  No family history on file. Social History:  reports that he has never smoked. He has never used smokeless tobacco. He reports that he does not drink alcohol. No history on file for drug use.  Allergies: No Known Allergies  No medications prior to admission.    Review of systems otherwise negative  There were no vitals taken for this visit.  Physical exam: Mental status: oriented x3. Eyes: See eye exam associated with this date of surgery Ears, Nose, Throat: within normal limits Neck: Within Normal limits General: within normal limits Chest: Within normal limits Breast: deferred Heart: Within normal limits Abdomen: Within normal limits GU: deferred Extremities: within normal limits Skin: within normal limits  Assessment/Plan Full thickness macular hole OD  Plan: To [facility] for 25g PPV w/ ILM peel and gas OD under general anesthesia - case scheduled for Thursday, 11.16.23, 1130 am -- [facility] OR 08  Karie Chimera, M.D., Ph.D. Vitreoretinal Surgeon [facility]

## 2022-09-02 ENCOUNTER — Encounter (HOSPITAL_COMMUNITY): Payer: Self-pay | Admitting: Ophthalmology

## 2022-09-02 NOTE — Progress Notes (Signed)
Left a detailed message on machine with instructions for DOS.  PCP - Dr Fleet Contras Cardiologist - n/a  Chest x-ray - n/a EKG - DOS Stress Test - n/a ECHO - n/a Cardiac Cath - n/a  ICD Pacemaker/Loop - n/a  Sleep Study -  n/a CPAP - none  STOP now taking any Aspirin (unless otherwise instructed by your surgeon), Aleve, Naproxen, Ibuprofen, Motrin, Advil, Goody's, BC's, all herbal medications, fish oil, and all vitamins.

## 2022-09-03 ENCOUNTER — Encounter (HOSPITAL_COMMUNITY): Payer: Self-pay | Admitting: Ophthalmology

## 2022-09-03 ENCOUNTER — Other Ambulatory Visit: Payer: Self-pay

## 2022-09-03 ENCOUNTER — Ambulatory Visit (HOSPITAL_COMMUNITY)
Admission: RE | Admit: 2022-09-03 | Discharge: 2022-09-03 | Disposition: A | Discharge status: Home / Self Care | Payer: BC Managed Care – PPO | Attending: Ophthalmology | Admitting: Ophthalmology

## 2022-09-03 ENCOUNTER — Encounter (HOSPITAL_COMMUNITY)
Admission: RE | Disposition: A | Discharge status: Home / Self Care | Payer: Self-pay | Source: Home / Self Care | Attending: Ophthalmology

## 2022-09-03 ENCOUNTER — Ambulatory Visit (HOSPITAL_COMMUNITY): Payer: BC Managed Care – PPO | Admitting: Certified Registered Nurse Anesthetist

## 2022-09-03 DIAGNOSIS — E119 Type 2 diabetes mellitus without complications: Secondary | ICD-10-CM | POA: Diagnosis not present

## 2022-09-03 DIAGNOSIS — I10 Essential (primary) hypertension: Secondary | ICD-10-CM | POA: Diagnosis not present

## 2022-09-03 DIAGNOSIS — H35341 Macular cyst, hole, or pseudohole, right eye: Secondary | ICD-10-CM | POA: Diagnosis not present

## 2022-09-03 DIAGNOSIS — E669 Obesity, unspecified: Secondary | ICD-10-CM | POA: Insufficient documentation

## 2022-09-03 HISTORY — PX: GAS INSERTION: SHX5336

## 2022-09-03 HISTORY — DX: Essential (primary) hypertension: I10

## 2022-09-03 HISTORY — PX: PARS PLANA VITRECTOMY: SHX2166

## 2022-09-03 HISTORY — DX: Type 2 diabetes mellitus without complications: E11.9

## 2022-09-03 HISTORY — PX: LASER PHOTO ABLATION: SHX5942

## 2022-09-03 HISTORY — PX: GAS/FLUID EXCHANGE: SHX5334

## 2022-09-03 HISTORY — DX: Gastro-esophageal reflux disease without esophagitis: K21.9

## 2022-09-03 HISTORY — PX: MEMBRANE PEEL: SHX5967

## 2022-09-03 LAB — BASIC METABOLIC PANEL
Anion gap: 6 (ref 5–15)
BUN: 11 mg/dL (ref 6–20)
CO2: 28 mmol/L (ref 22–32)
Calcium: 9.1 mg/dL (ref 8.9–10.3)
Chloride: 103 mmol/L (ref 98–111)
Creatinine, Ser: 0.85 mg/dL (ref 0.61–1.24)
GFR, Estimated: >60 mL/min (ref >60)
Glucose, Bld: 161 mg/dL — ABNORMAL HIGH (ref 70–99)
Potassium: 4 mmol/L (ref 3.5–5.1)
Sodium: 137 mmol/L (ref 135–145)

## 2022-09-03 LAB — CBC
HCT: 46.7 % (ref 39.0–52.0)
Hemoglobin: 16.2 g/dL (ref 13.0–17.0)
MCH: 27.3 pg (ref 26.0–34.0)
MCHC: 34.7 g/dL (ref 30.0–36.0)
MCV: 78.6 fL — ABNORMAL LOW (ref 80.0–100.0)
Platelets: 291 10*3/uL (ref 150–400)
RBC: 5.94 MIL/uL — ABNORMAL HIGH (ref 4.22–5.81)
RDW: 13.2 % (ref 11.5–15.5)
WBC: 6.2 10*3/uL (ref 4.0–10.5)
nRBC: 0 % (ref 0.0–0.2)

## 2022-09-03 LAB — GLUCOSE, CAPILLARY
Glucose-Capillary: 164 mg/dL — ABNORMAL HIGH (ref 70–99)
Glucose-Capillary: 149 mg/dL — ABNORMAL HIGH (ref 70–99)

## 2022-09-03 SURGERY — PARS PLANA VITRECTOMY WITH 25 GAUGE
Anesthesia: General | Site: Eye | Laterality: Right

## 2022-09-03 MED ORDER — STERILE WATER FOR INJECTION IJ SOLN
INTRAMUSCULAR | Status: DC | PRN
  Administered 2022-09-03: 250 mL

## 2022-09-03 MED ORDER — EPINEPHRINE PF 1 MG/ML IJ SOLN
INTRAOCULAR | Status: DC | PRN
  Administered 2022-09-03: 500 mL

## 2022-09-03 MED ORDER — ATROPINE SULFATE 1 % OP SOLN
OPHTHALMIC | Status: AC
  Filled 2022-09-03: qty 5

## 2022-09-03 MED ORDER — ROCURONIUM BROMIDE 10 MG/ML (PF) SYRINGE
PREFILLED_SYRINGE | INTRAVENOUS | Status: AC
  Filled 2022-09-03: qty 10

## 2022-09-03 MED ORDER — ATROPINE SULFATE 1 % OP SOLN
OPHTHALMIC | Status: DC | PRN
  Administered 2022-09-03: 1 [drp] via OPHTHALMIC

## 2022-09-03 MED ORDER — LACTATED RINGERS IV SOLN: INTRAVENOUS | Status: DC

## 2022-09-03 MED ORDER — FENTANYL CITRATE (PF) 250 MCG/5ML IJ SOLN
INTRAMUSCULAR | Status: AC
  Filled 2022-09-03: qty 5

## 2022-09-03 MED ORDER — ATROPINE SULFATE 1 % OP SOLN
1.0000 [drp] | OPHTHALMIC | Status: AC | PRN
  Administered 2022-09-03 (×3): 1 [drp] via OPHTHALMIC
  Filled 2022-09-03 (×2): qty 2

## 2022-09-03 MED ORDER — SUGAMMADEX SODIUM 200 MG/2ML IV SOLN
INTRAVENOUS | Status: DC | PRN
  Administered 2022-09-03: 200 mg via INTRAVENOUS

## 2022-09-03 MED ORDER — PROPOFOL 10 MG/ML IV BOLUS
INTRAVENOUS | Status: AC
  Filled 2022-09-03: qty 20

## 2022-09-03 MED ORDER — DEXAMETHASONE SODIUM PHOSPHATE 10 MG/ML IJ SOLN
INTRAMUSCULAR | Status: AC
  Filled 2022-09-03: qty 1

## 2022-09-03 MED ORDER — CEFTAZIDIME 1 G IJ SOLR
INTRAMUSCULAR | Status: AC
  Filled 2022-09-03: qty 1

## 2022-09-03 MED ORDER — PHENYLEPHRINE HCL 10 % OP SOLN
1.0000 [drp] | OPHTHALMIC | Status: AC | PRN
  Administered 2022-09-03 (×3): 1 [drp] via OPHTHALMIC
  Filled 2022-09-03: qty 5

## 2022-09-03 MED ORDER — BACITRACIN-POLYMYXIN B 500-10000 UNIT/GM OP OINT
TOPICAL_OINTMENT | OPHTHALMIC | Status: AC
  Filled 2022-09-03: qty 3.5

## 2022-09-03 MED ORDER — KETOROLAC TROMETHAMINE 30 MG/ML IJ SOLN
INTRAMUSCULAR | Status: AC
  Filled 2022-09-03: qty 2

## 2022-09-03 MED ORDER — PREDNISOLONE ACETATE 1 % OP SUSP
OPHTHALMIC | Status: DC | PRN
  Administered 2022-09-03: 1 [drp] via OPHTHALMIC

## 2022-09-03 MED ORDER — CARBACHOL 0.01 % IO SOLN
INTRAOCULAR | Status: AC
  Filled 2022-09-03: qty 1.5

## 2022-09-03 MED ORDER — SODIUM CHLORIDE 0.9 % IV SOLN: INTRAVENOUS | Status: DC

## 2022-09-03 MED ORDER — DORZOLAMIDE HCL-TIMOLOL MAL 2-0.5 % OP SOLN
OPHTHALMIC | Status: DC | PRN
  Administered 2022-09-03: 1 [drp] via OPHTHALMIC

## 2022-09-03 MED ORDER — BACITRACIN-POLYMYXIN B 500-10000 UNIT/GM OP OINT
TOPICAL_OINTMENT | OPHTHALMIC | Status: DC | PRN
  Administered 2022-09-03: 1 via OPHTHALMIC

## 2022-09-03 MED ORDER — GATIFLOXACIN 0.5 % OP SOLN OPTIME - NO CHARGE
OPHTHALMIC | Status: DC | PRN
  Administered 2022-09-03: 1 [drp] via OPHTHALMIC

## 2022-09-03 MED ORDER — ROCURONIUM BROMIDE 10 MG/ML (PF) SYRINGE
PREFILLED_SYRINGE | INTRAVENOUS | Status: DC | PRN
  Administered 2022-09-03: 20 mg via INTRAVENOUS
  Administered 2022-09-03: 60 mg via INTRAVENOUS

## 2022-09-03 MED ORDER — TROPICAMIDE 1 % OP SOLN
1.0000 [drp] | OPHTHALMIC | Status: AC | PRN
  Administered 2022-09-03 (×3): 1 [drp] via OPHTHALMIC
  Filled 2022-09-03: qty 15

## 2022-09-03 MED ORDER — BRILLIANT BLUE G 0.025 % IO SOSY
0.5000 mL | PREFILLED_SYRINGE | INTRAOCULAR | Status: DC
  Filled 2022-09-03: qty 0.5

## 2022-09-03 MED ORDER — DORZOLAMIDE HCL-TIMOLOL MAL 2-0.5 % OP SOLN
OPHTHALMIC | Status: AC
  Filled 2022-09-03: qty 10

## 2022-09-03 MED ORDER — LIDOCAINE 2% (20 MG/ML) 5 ML SYRINGE
INTRAMUSCULAR | Status: DC | PRN
  Administered 2022-09-03: 100 mg via INTRAVENOUS

## 2022-09-03 MED ORDER — MIDAZOLAM HCL 2 MG/2ML IJ SOLN
INTRAMUSCULAR | Status: AC
  Filled 2022-09-03: qty 2

## 2022-09-03 MED ORDER — 0.9 % SODIUM CHLORIDE (POUR BTL) OPTIME
TOPICAL | Status: DC | PRN
  Administered 2022-09-03: 200 mL

## 2022-09-03 MED ORDER — FENTANYL CITRATE (PF) 250 MCG/5ML IJ SOLN
INTRAMUSCULAR | Status: DC | PRN
  Administered 2022-09-03 (×3): 50 ug via INTRAVENOUS

## 2022-09-03 MED ORDER — STERILE WATER FOR INJECTION IJ SOLN
INTRAMUSCULAR | Status: AC
  Filled 2022-09-03: qty 10

## 2022-09-03 MED ORDER — TRIAMCINOLONE ACETONIDE 40 MG/ML IJ SUSP
INTRAMUSCULAR | Status: AC
  Filled 2022-09-03: qty 5

## 2022-09-03 MED ORDER — ORAL CARE MOUTH RINSE: 15.0000 mL | Freq: Once | OROMUCOSAL | Status: AC

## 2022-09-03 MED ORDER — BALANCED SALT IO SOLN
INTRAOCULAR | Status: DC | PRN
  Administered 2022-09-03: 15 mL via INTRAOCULAR

## 2022-09-03 MED ORDER — BUPIVACAINE HCL (PF) 0.75 % IJ SOLN
INTRAMUSCULAR | Status: AC
  Filled 2022-09-03: qty 10

## 2022-09-03 MED ORDER — BSS PLUS IO SOLN
INTRAOCULAR | Status: AC
  Filled 2022-09-03: qty 500

## 2022-09-03 MED ORDER — NA CHONDROIT SULF-NA HYALURON 40-30 MG/ML IO SOSY
INTRAOCULAR | Status: AC
  Filled 2022-09-03: qty 1

## 2022-09-03 MED ORDER — TOBRAMYCIN-DEXAMETHASONE 0.3-0.1 % OP OINT
TOPICAL_OINTMENT | OPHTHALMIC | Status: AC
  Filled 2022-09-03: qty 3.5

## 2022-09-03 MED ORDER — BRIMONIDINE TARTRATE 0.2 % OP SOLN
OPHTHALMIC | Status: DC | PRN
  Administered 2022-09-03: 1 [drp] via OPHTHALMIC

## 2022-09-03 MED ORDER — SODIUM CHLORIDE (PF) 0.9 % IJ SOLN
INTRAMUSCULAR | Status: AC
  Filled 2022-09-03: qty 10

## 2022-09-03 MED ORDER — NA CHONDROIT SULF-NA HYALURON 40-30 MG/ML IO SOSY
INTRAOCULAR | Status: DC | PRN
  Administered 2022-09-03: .5 mL via INTRAOCULAR

## 2022-09-03 MED ORDER — CHLORHEXIDINE GLUCONATE 0.12 % MT SOLN
15.0000 mL | Freq: Once | OROMUCOSAL | Status: AC
  Administered 2022-09-03: 15 mL via OROMUCOSAL
  Filled 2022-09-03: qty 15

## 2022-09-03 MED ORDER — PREDNISOLONE ACETATE 1 % OP SUSP
OPHTHALMIC | Status: AC
  Filled 2022-09-03: qty 5

## 2022-09-03 MED ORDER — DEXAMETHASONE SODIUM PHOSPHATE 10 MG/ML IJ SOLN
INTRAMUSCULAR | Status: DC | PRN
  Administered 2022-09-03: 4 mg via INTRAVENOUS

## 2022-09-03 MED ORDER — POLYMYXIN B SULFATE 500000 UNITS IJ SOLR
INTRAMUSCULAR | Status: AC
  Filled 2022-09-03: qty 10

## 2022-09-03 MED ORDER — PHENYLEPHRINE HCL-NACL 20-0.9 MG/250ML-% IV SOLN
INTRAVENOUS | Status: DC | PRN
  Administered 2022-09-03: 20 ug/min via INTRAVENOUS

## 2022-09-03 MED ORDER — MIDAZOLAM HCL 2 MG/2ML IJ SOLN
INTRAMUSCULAR | Status: DC | PRN
  Administered 2022-09-03: 2 mg via INTRAVENOUS

## 2022-09-03 MED ORDER — ONDANSETRON HCL 4 MG/2ML IJ SOLN
INTRAMUSCULAR | Status: AC
  Filled 2022-09-03: qty 2

## 2022-09-03 MED ORDER — BSS IO SOLN
INTRAOCULAR | Status: AC
  Filled 2022-09-03: qty 15

## 2022-09-03 MED ORDER — INSULIN ASPART 100 UNIT/ML IJ SOLN
0.0000 [IU] | INTRAMUSCULAR | Status: DC | PRN
  Administered 2022-09-03: 2 [IU] via SUBCUTANEOUS

## 2022-09-03 MED ORDER — LIDOCAINE HCL (PF) 2 % IJ SOLN
INTRAMUSCULAR | Status: AC
  Filled 2022-09-03: qty 10

## 2022-09-03 MED ORDER — BRILLIANT BLUE G 0.025 % IO SOSY
PREFILLED_SYRINGE | INTRAOCULAR | Status: DC | PRN
  Administered 2022-09-03: .5 mL via INTRAVITREAL

## 2022-09-03 MED ORDER — GATIFLOXACIN 0.5 % OP SOLN
OPHTHALMIC | Status: AC
  Filled 2022-09-03: qty 2.5

## 2022-09-03 MED ORDER — TRIAMCINOLONE ACETONIDE 40 MG/ML IJ SUSP
INTRAMUSCULAR | Status: DC | PRN
  Administered 2022-09-03: 40 mg

## 2022-09-03 MED ORDER — INSULIN ASPART 100 UNIT/ML IJ SOLN
INTRAMUSCULAR | Status: AC
  Filled 2022-09-03: qty 1

## 2022-09-03 MED ORDER — BRIMONIDINE TARTRATE 0.2 % OP SOLN
OPHTHALMIC | Status: AC
  Filled 2022-09-03: qty 5

## 2022-09-03 MED ORDER — ONDANSETRON HCL 4 MG/2ML IJ SOLN
INTRAMUSCULAR | Status: DC | PRN
  Administered 2022-09-03: 4 mg via INTRAVENOUS

## 2022-09-03 MED ORDER — EPINEPHRINE PF 1 MG/ML IJ SOLN
INTRAMUSCULAR | Status: AC
  Filled 2022-09-03: qty 1

## 2022-09-03 MED ORDER — STERILE WATER FOR INJECTION IJ SOLN
INTRAMUSCULAR | Status: DC | PRN
  Administered 2022-09-03: 20 mL

## 2022-09-03 MED ORDER — PROPARACAINE HCL 0.5 % OP SOLN
1.0000 [drp] | OPHTHALMIC | Status: AC | PRN
  Administered 2022-09-03 (×3): 1 [drp] via OPHTHALMIC
  Filled 2022-09-03: qty 15

## 2022-09-03 MED ORDER — LIDOCAINE 2% (20 MG/ML) 5 ML SYRINGE
INTRAMUSCULAR | Status: AC
  Filled 2022-09-03: qty 5

## 2022-09-03 MED ORDER — ACETAZOLAMIDE SODIUM 500 MG IJ SOLR
INTRAMUSCULAR | Status: AC
  Filled 2022-09-03: qty 500

## 2022-09-03 MED ORDER — PROPOFOL 10 MG/ML IV BOLUS
INTRAVENOUS | Status: DC | PRN
  Administered 2022-09-03: 160 mg via INTRAVENOUS

## 2022-09-03 SURGICAL SUPPLY — 41 items
APPLICATOR COTTON TIP 6 STRL (MISCELLANEOUS) ×8 IMPLANT
APPLICATOR COTTON TIP 6IN STRL (MISCELLANEOUS) ×8 IMPLANT
BAND WRIST GAS GREEN (MISCELLANEOUS) ×1 IMPLANT
BNDG EYE OVAL (GAUZE/BANDAGES/DRESSINGS) ×2 IMPLANT
CABLE BIPOLOR RESECTION CORD (MISCELLANEOUS) ×2 IMPLANT
CANNULA FLEX TIP 25G (CANNULA) ×2 IMPLANT
CLSR STERI-STRIP ANTIMIC 1/2X4 (GAUZE/BANDAGES/DRESSINGS) ×2 IMPLANT
DRAPE MICROSCOPE LEICA 46X105 (MISCELLANEOUS) ×2 IMPLANT
DRAPE OPHTHALMIC 77X100 STRL (CUSTOM PROCEDURE TRAY) ×2 IMPLANT
FORCEPS GRIESHABER ILM 25G A (INSTRUMENTS) ×1 IMPLANT
GAS AUTO FILL CONSTEL (OPHTHALMIC) ×2
GAS AUTO FILL CONSTELLATION (OPHTHALMIC) ×1 IMPLANT
GAS WRIST BAND GREEN (MISCELLANEOUS) ×2
GLOVE BIO SURGEON STRL SZ7.5 (GLOVE) ×4 IMPLANT
GLOVE BIOGEL M 7.0 STRL (GLOVE) ×2 IMPLANT
GOWN STRL REUS W/ TWL LRG LVL3 (GOWN DISPOSABLE) ×4 IMPLANT
GOWN STRL REUS W/ TWL XL LVL3 (GOWN DISPOSABLE) ×2 IMPLANT
GOWN STRL REUS W/TWL LRG LVL3 (GOWN DISPOSABLE) ×4
GOWN STRL REUS W/TWL XL LVL3 (GOWN DISPOSABLE) ×2
KIT BASIN OR (CUSTOM PROCEDURE TRAY) ×2 IMPLANT
LENS VITRECTOMY FLAT OCLR DISP (MISCELLANEOUS) ×1 IMPLANT
LOOP FINESSE 25 GA (MISCELLANEOUS) ×1 IMPLANT
MICROPICK 25G (MISCELLANEOUS)
NDL 18GX1X1/2 (RX/OR ONLY) (NEEDLE) ×3 IMPLANT
NDL 25GX 5/8IN NON SAFETY (NEEDLE) ×2 IMPLANT
NDL HYPO 30X.5 LL (NEEDLE) IMPLANT
NEEDLE 18GX1X1/2 (RX/OR ONLY) (NEEDLE) ×6 IMPLANT
NEEDLE 25GX 5/8IN NON SAFETY (NEEDLE) ×4 IMPLANT
NEEDLE HYPO 30X.5 LL (NEEDLE) IMPLANT
PACK VITRECTOMY CUSTOM (CUSTOM PROCEDURE TRAY) ×2 IMPLANT
PAD ARMBOARD 7.5X6 YLW CONV (MISCELLANEOUS) ×4 IMPLANT
PAK PIK VITRECTOMY CVS 25GA (OPHTHALMIC) ×2 IMPLANT
PICK MICROPICK 25G (MISCELLANEOUS) IMPLANT
PROBE ENDO DIATHERMY 25G (MISCELLANEOUS) ×2 IMPLANT
PROBE LASER ILLUM FLEX CVD 25G (OPHTHALMIC) ×1 IMPLANT
SUT VICRYL 7 0 TG140 8 (SUTURE) ×2 IMPLANT
SYR 10ML LL (SYRINGE) ×2 IMPLANT
SYR TB 1ML LUER SLIP (SYRINGE) ×2 IMPLANT
TOWEL GREEN STERILE FF (TOWEL DISPOSABLE) ×2 IMPLANT
TUBING HIGH PRESS EXTEN 6IN (TUBING) ×2 IMPLANT
WATER STERILE IRR 1000ML POUR (IV SOLUTION) ×2 IMPLANT

## 2022-09-03 NOTE — Op Note (Signed)
Date of procedure:  09/03/2022   Surgeon: Bernarda Caffey, MD, PhD   Assistant: Ernest Mallick, Ophthalmic Assistant   Pre-operative Diagnoses: Full thickness macular hole, RIGHT Eye   Post-operative diagnosis: Full thickness macular hole, RIGHT Eye   Anesthesia: General   Procedure:   1. 25 gauge pars plana vitrectomy, RIGHT Eye 2. TissueBlue ILM stain, RIGHT Eye 3. Internal Limiting Membrane peel, RIGHT Eye  4. Endolaser, RIGHT Eye 5. Injection 14% C3F8, RIGHT Eye     Indications for procedure: The patient has an ophthalmic history significant for a retinal detachment of the right eye, s/p retinal detachment repair x2 in the early 2000s. He presented to our clinic with a full thickness macular hole and central visual loss consistent with a scotoma.  After discussing the risks, benefits, and alternatives to surgery, the patient electively decided to undergo surgical repair and informed consent was obtained.  The surgery was an attempt to close the macular hole and potentially improve the vision within the reasonable expectations of the surgeon.    Procedure in Detail:    The patient was met in the pre-operative holding area where their identification data was verified.  It was noted that there was a signed, informed consent in the chart and the RIGHT Eye eye was verbally verified by the patient as the operative eye and was marked with a marking pen. The patient was then taken to the operating room and placed in the supine position. General endotracheal anesthesia was induced.   The eye was then prepped with 5% betadine and draped in the normal fashion for ophthalmic surgery. The microscope was draped and swung into position, and a secondary time-out was performed to identify the correct patient, eyes, procedures, and any allergies.   A 25 gauge trocar was inserted in a 30-45 degrees fashion into the inferotemporal quadrant ~4 mm posterior to the limbus in this phakic patient.  Correct  positioning within the vitreous was verified externally with the light pipe.  The infusion was then connected to the cannula and BSS infusion was commenced.  Additional ports were placed in the superonasal and superotemporal quadrants. Viscoat was placed on the cornea. The BIOM was used to visualize the posterior segment while the core vitrectomy was completed. Of note, secondary to his history of prior retinal detachment repairs, there was virtually no vitreous remaining in the eye. The patient had a visible full thickness macular hole and a significant epiretinal membrane on the macula. A posterior vitreous detachment was confirmed using suction over the optic nerve head and lifting anteriorly. Kenalog was used to aid in this process. A thorough peripheral vitrectomy was performed and again, minimal vitreous was encountered.      TissueBlue was then used to stain the internal limiting membrane. A macular contact lens was placed on the eye. End-grasping ILM forceps were used to create an opening in the ILM and the ILM with overlying ERM was peeled fully from the macula taking care to avoid traction on the macular hole. There were no complications.   The wide angle viewing system was brought back into position. Scleral depression was performed and used to inspect the peripheral retina. On 360 inspection, there were large patches of CR scarring consistent with his history of prior RD repair. There were no new retinal tears or breaks. Endolaser was used to place prophylactic peripheral laser, 360 degrees, just posterior to all of the existing CR scars. An air fluid exchange was then performed.    The superotemporal  port was then removed and sutured with 7-0 vicryl, there was no leakage. 14% C3F8 gas was connected to the infusion line and gas was injected into the posterior segment while venting air through the superonasal trocar using the extrusion cannula. Once a full, 40cc of gas was vented through the eye,  the infusion port and venting ports were removed and they were sutured with 7-0 vicryl.  There was no leakage from the sclerotomy sites.   Subconjunctival injections of kefzol + bacitracin + polymixin b and kenalog were then administered, and antibiotic and steroid drops as well as antibiotic ointment were placed in the eye.  The drapes were removed and the eye was patched and shielded.  A green gas bracelet was placed on the patient's wrist. The patient was then taken to the post-operative area for recovery having tolerated the procedure well.  He was instructed to perform face down positioning postoperatively and to follow up in clinic the following morning as scheduled.   Estimate blood lost: none Complications: None

## 2022-09-03 NOTE — Transfer of Care (Signed)
Immediate Anesthesia Transfer of Care Note  Patient: Kenneth Perez  Procedure(s) Performed: PARS PLANA VITRECTOMY WITH 25 GAUGE (Right: Eye) MEMBRANE PEEL (Right: Eye) LASER PHOTO ABLATION (Right: Eye) GAS/FLUID EXCHANGE (Right: Eye) INSERTION OF GAS (Right: Eye)  Patient Location: PACU  Anesthesia Type:General  Level of Consciousness: drowsy  Airway & Oxygen Therapy: Patient Spontanous Breathing  Post-op Assessment: Report given to RN and Post -op Vital signs reviewed and stable  Post vital signs: Reviewed and stable  Last Vitals:  Vitals Value Taken Time  BP 132/91   Temp    Pulse 84 09/03/22 1435  Resp 31 09/03/22 1435  SpO2 98 % 09/03/22 1435  Vitals shown include unvalidated device data.  Last Pain:  Vitals:   09/03/22 0946  PainSc: 0-No pain      Patients Stated Pain Goal: 0 (09/03/22 0946)  Complications: No notable events documented.

## 2022-09-03 NOTE — Brief Op Note (Signed)
09/03/2022  2:56 PM  PATIENT:  Kenneth Perez  61 y.o. male  PRE-OPERATIVE DIAGNOSIS:  full thickness macular hole, right eye  POST-OPERATIVE DIAGNOSIS:  full thickness macular hole, right eye  PROCEDURE:  Procedure(s): PARS PLANA VITRECTOMY WITH 25 GAUGE (Right) MEMBRANE PEEL (Right) LASER PHOTO ABLATION (Right) GAS/FLUID EXCHANGE (Right) INSERTION OF GAS (Right)  SURGEON:  Surgeon(s) and Role:    Rennis Chris, MD - Primary  ASSISTANTS: Laurian Brim, Ophthalmic Assistant    ANESTHESIA:   general  EBL:  1 mL   BLOOD ADMINISTERED:none  DRAINS: none   LOCAL MEDICATIONS USED:  NONE  SPECIMEN:  No Specimen  DISPOSITION OF SPECIMEN:  N/A  COUNTS:  YES  TOURNIQUET:  * No tourniquets in log *  DICTATION: .Note written in EPIC  PLAN OF CARE: Discharge to home after PACU  PATIENT DISPOSITION:  PACU - hemodynamically stable.   Delay start of Pharmacological VTE agent (>24hrs) due to surgical blood loss or risk of bleeding: not applicable

## 2022-09-03 NOTE — Progress Notes (Signed)
Dr. Arby Barrette made aware of patient's elevated BP readings upon arrival to Short Stay- See vital signs flowsheet. Per Dr. Arby Barrette- contacted Dr. Vanessa Barbara and inquired if the surgery has to be done today or if patient can contact PCP for BP management and then reschedule surgery. Per Dr. Vanessa Barbara it will be 2 weeks or more before surgery can be rescheduled so if BP can be managed today during surgery then he would like to proceed. Dr. Arby Barrette made aware and agreed to proceed with surgey.

## 2022-09-03 NOTE — Anesthesia Procedure Notes (Signed)
Procedure Name: Intubation Date/Time: 09/03/2022 12:40 PM  Performed by: Pearson Grippe, CRNAPre-anesthesia Checklist: Patient identified, Emergency Drugs available, Suction available and Patient being monitored Patient Re-evaluated:Patient Re-evaluated prior to induction Oxygen Delivery Method: Circle system utilized Preoxygenation: Pre-oxygenation with 100% oxygen Induction Type: IV induction Ventilation: Mask ventilation without difficulty Laryngoscope Size: Miller and 2 Tube type: Oral Tube size: 7.5 mm Number of attempts: 1 Airway Equipment and Method: Stylet Placement Confirmation: ETT inserted through vocal cords under direct vision, positive ETCO2 and breath sounds checked- equal and bilateral Secured at: 23 cm Tube secured with: Tape Dental Injury: Teeth and Oropharynx as per pre-operative assessment

## 2022-09-03 NOTE — Interval H&P Note (Signed)
History and Physical Interval Note:  09/03/2022 11:43 AM  Kenneth Perez  has presented today for surgery, with the diagnosis of full thickness macular hole, right eye.  The various methods of treatment have been discussed with the patient and family. After consideration of risks, benefits and other options for treatment, the patient has consented to  Procedure(s): 25 GAUGE PARS PLANA VITRECTOMY WITH 20 GAUGE MVR PORT FOR MACULAR HOLE (Right) as a surgical intervention.  The patient's history has been reviewed, patient examined, no change in status, stable for surgery.  I have reviewed the patient's chart and labs.  Questions were answered to the patient's satisfaction.     Rennis Chris

## 2022-09-03 NOTE — Anesthesia Preprocedure Evaluation (Addendum)
Anesthesia Evaluation  Patient identified by MRN, date of birth, ID band Patient awake    Reviewed: Allergy & Precautions, NPO status , Patient's Chart, lab work & pertinent test results  Airway Mallampati: I       Dental no notable dental hx.    Pulmonary neg pulmonary ROS   Pulmonary exam normal        Cardiovascular hypertension, Pt. on medications Normal cardiovascular exam     Neuro/Psych negative neurological ROS  negative psych ROS   GI/Hepatic Neg liver ROS,GERD  Medicated and Controlled,,  Endo/Other  diabetes, Type 2    Renal/GU negative Renal ROS  negative genitourinary   Musculoskeletal negative musculoskeletal ROS (+)    Abdominal  (+) + obese  Peds  Hematology negative hematology ROS (+)   Anesthesia Other Findings   Reproductive/Obstetrics                             Anesthesia Physical Anesthesia Plan  ASA: 2  Anesthesia Plan: General   Post-op Pain Management:    Induction: Intravenous  PONV Risk Score and Plan: Ondansetron, Midazolam and Treatment may vary due to age or medical condition  Airway Management Planned: LMA  Additional Equipment: None  Intra-op Plan:   Post-operative Plan: Extubation in OR  Informed Consent: I have reviewed the patients History and Physical, chart, labs and discussed the procedure including the risks, benefits and alternatives for the proposed anesthesia with the patient or authorized representative who has indicated his/her understanding and acceptance.     Dental advisory given  Plan Discussed with: CRNA  Anesthesia Plan Comments:        Anesthesia Quick Evaluation

## 2022-09-03 NOTE — OR Nursing (Signed)
Green gas bracelet place on right wrist

## 2022-09-03 NOTE — Discharge Instructions (Signed)
POSTOPERATIVE INSTRUCTIONS  Your doctor has performed vitreoretinal surgery on you at [facility].  - Keep eye patched and shielded until seen by Dr. Vanessa Barbara 8 AM tomorrow in clinic - Do not use drops until return - FACE DOWN POSITIONING WHILE AWAKE - Sleep with belly down or on LEFT side, avoid laying flat on back.    - No strenuous bending, stooping or lifting.  - You may not drive until further notice.  - If your doctor used a gas bubble in your eye during the procedure he will advise you on postoperative positioning. If you have a gas bubble you will be wearing a green bracelet that was applied in the operating room. The green bracelet should stay on as long as the gas bubble is in your eye. While the gas bubble is present you should not fly in an airplane. If you require general anesthesia while the gas bubble is present you must notify your anesthesiologist that an intraocular gas bubble is present so he can take the appropriate precautions.  - Tylenol or any other over-the-counter pain reliever can be used according to your doctor. If more pain medicine is required, your doctor will have a prescription for you.  - You may read, go up and down stairs, and watch television.     Rennis Chris, M.D., Ph.D.

## 2022-09-04 ENCOUNTER — Ambulatory Visit (INDEPENDENT_AMBULATORY_CARE_PROVIDER_SITE_OTHER): Payer: BC Managed Care – PPO | Admitting: Ophthalmology

## 2022-09-04 ENCOUNTER — Encounter (HOSPITAL_COMMUNITY): Payer: Self-pay | Admitting: Ophthalmology

## 2022-09-04 DIAGNOSIS — H35341 Macular cyst, hole, or pseudohole, right eye: Secondary | ICD-10-CM

## 2022-09-04 DIAGNOSIS — Z8669 Personal history of other diseases of the nervous system and sense organs: Secondary | ICD-10-CM

## 2022-09-04 DIAGNOSIS — E119 Type 2 diabetes mellitus without complications: Secondary | ICD-10-CM

## 2022-09-04 DIAGNOSIS — H04123 Dry eye syndrome of bilateral lacrimal glands: Secondary | ICD-10-CM

## 2022-09-04 DIAGNOSIS — I10 Essential (primary) hypertension: Secondary | ICD-10-CM

## 2022-09-04 DIAGNOSIS — Z961 Presence of intraocular lens: Secondary | ICD-10-CM

## 2022-09-04 DIAGNOSIS — H33312 Horseshoe tear of retina without detachment, left eye: Secondary | ICD-10-CM

## 2022-09-04 DIAGNOSIS — H35373 Puckering of macula, bilateral: Secondary | ICD-10-CM

## 2022-09-04 DIAGNOSIS — H35033 Hypertensive retinopathy, bilateral: Secondary | ICD-10-CM

## 2022-09-04 DIAGNOSIS — H25812 Combined forms of age-related cataract, left eye: Secondary | ICD-10-CM

## 2022-09-04 NOTE — Anesthesia Postprocedure Evaluation (Signed)
Anesthesia Post Note  Patient: Kenneth Perez  Procedure(s) Performed: PARS PLANA VITRECTOMY WITH 25 GAUGE (Right: Eye) MEMBRANE PEEL (Right: Eye) LASER PHOTO ABLATION (Right: Eye) GAS/FLUID EXCHANGE (Right: Eye) INSERTION OF GAS (Right: Eye)     Patient location during evaluation: PACU Anesthesia Type: General Level of consciousness: awake Pain management: pain level controlled Vital Signs Assessment: post-procedure vital signs reviewed and stable Respiratory status: spontaneous breathing Cardiovascular status: stable Postop Assessment: no apparent nausea or vomiting Anesthetic complications: no  No notable events documented.  Last Vitals:  Vitals:   09/03/22 1500 09/03/22 1515  BP: (!) 141/100 (!) 136/100  Pulse: 81 84  Resp: 20 20  Temp:  36.7 C  SpO2: 91% 93%    Last Pain:  Vitals:   09/03/22 1515  PainSc: 0-No pain                 John F Scharlene Corn

## 2022-09-07 NOTE — Progress Notes (Signed)
[facility] Note  09/09/2022     CHIEF COMPLAINT Patient presents for No chief complaint on file.   HISTORY OF PRESENT ILLNESS: Kenneth Perez is a 61 y.o. male who presents to the clinic today for:     Referring physician: Nolene Ebbs, MD [place],  [place] [id number]  HISTORICAL INFORMATION:   Selected notes from the MEDICAL RECORD NUMBER Referred by Dr. Rona Ravens for concern of RD OD  LEE:  Ocular Hx- PMH-    CURRENT MEDICATIONS: No current outpatient medications on file. (Ophthalmic Drugs)   No current facility-administered medications for this visit. (Ophthalmic Drugs)   Current Outpatient Medications (Other)  Medication Sig   glimepiride (AMARYL) 4 MG tablet Take 4 mg by mouth daily.   ibuprofen (ADVIL,MOTRIN) 200 MG tablet Take 200 mg by mouth every 6 (six) hours as needed for mild pain.   losartan-hydrochlorothiazide (HYZAAR) 100-12.5 MG tablet Take 1 tablet by mouth daily.   metFORMIN (GLUCOPHAGE) 500 MG tablet Take 500 mg by mouth 2 (two) times daily.   omeprazole (PRILOSEC) 20 MG capsule Take 20 mg by mouth 2 (two) times daily.   Vitamin D, Ergocalciferol, (DRISDOL) 1.25 MG (50000 UNIT) CAPS capsule Take 50,000 Units by mouth every Thursday.   No current facility-administered medications for this visit. (Other)   REVIEW OF SYSTEMS:   ALLERGIES No Known Allergies  PAST MEDICAL HISTORY Past Medical History:  Diagnosis Date   Diabetes mellitus without complication ([place])    GERD (gastroesophageal reflux disease)    Hypertension    Past Surgical History:  Procedure Laterality Date   GAS INSERTION Right 09/03/2022   Procedure: INSERTION OF GAS;  Surgeon: Bernarda Caffey, MD;  Location: [facility];  Service: Ophthalmology;  Laterality: Right;   GAS/FLUID EXCHANGE Right 09/03/2022   Procedure: GAS/FLUID EXCHANGE;  Surgeon: Bernarda Caffey, MD;  Location: [place];  Service: Ophthalmology;  Laterality: Right;   LASER PHOTO  ABLATION Right 09/03/2022   Procedure: LASER PHOTO ABLATION;  Surgeon: Bernarda Caffey, MD;  Location: [place];  Service: Ophthalmology;  Laterality: Right;   MEMBRANE PEEL Right 09/03/2022   Procedure: MEMBRANE PEEL;  Surgeon: Bernarda Caffey, MD;  Location: [place];  Service: Ophthalmology;  Laterality: Right;   PARS PLANA VITRECTOMY Right 09/03/2022   Procedure: PARS PLANA VITRECTOMY WITH 25 GAUGE;  Surgeon: Bernarda Caffey, MD;  Location: [place];  Service: Ophthalmology;  Laterality: Right;   FAMILY HISTORY No family history on file.  SOCIAL HISTORY Social History   Tobacco Use   Smoking status: Never   Smokeless tobacco: Never  Vaping Use   Vaping Use: Never used  Substance Use Topics   Alcohol use: Never   Drug use: Never       OPHTHALMIC EXAM:  Not recorded    IMAGING AND PROCEDURES  Imaging and Procedures for 09/09/2022          ASSESSMENT/PLAN:  No diagnosis found.  1. Macular hole, right eye  - OCT shows full thickness macular hole with mild interval improvement in surrounding cystic changes - pre-op BCVA 20/80 from 20/40 from 20/200 - POD1 s/p PPV/TissueBlue/MP/14% C3F8 OS, 11.16.23             - doing well this morning             - retina attached and good gas bubble in place w/ mac hole closing             - IOP 16              -  start PF 6x/day OS                          zymaxid QID OS                         atropine BID OD                         PSO ung QID OS              - cont face down positioning x3 days then can decrease positioning to 50% of time; avoid laying flat on back              - eye shield when sleeping              - post op drop and positioning instructions reviewed              - tylenol/ibuprofen for pain  - next Weds, POV  2. Retinal tears, OS  - three small holes at 0100, three large retinal tears from 0130-0400 - s/p laser retinopexy OS (10.27.23) -- good laser changes surrounding all breaks - focal lattice degeneration at  1030 also treated - cont Lotemax QID OS - f/u 1 week, DFE, OCT  3. History of retinal detachment OD  - 1998 -- s/p repair in [place], then pt flew to [place] and underwent more definitive repair OD  - retina attached OD  - stable  4. ERM OU - mild ERM OD; OS w/ lamellar hole and pucker - BCVA OD 20/80 (from macular hole); OS 20/30 - asymptomatic, no metamorphopsia - monitor for now  5. Diabetes mellitus, type 2 without retinopathy  - diagnosed with DM2 in 2022 - The incidence, risk factors for progression, natural history and treatment options for diabetic retinopathy  were discussed with patient.   - The need for close monitoring of blood glucose, blood pressure, and serum lipids, avoiding cigarette or any type of tobacco, and the need for long term follow up was also discussed with patient. - f/u in 1 year, sooner prn  6,7. Hypertensive retinopathy OU - discussed importance of tight BP control - monitor  8. Mixed Cataract OS - The symptoms of cataract, surgical options, and treatments and risks were discussed with patient. - discussed diagnosis and progression - monitor for now  9. Pseudophakia OD  - s/p CE/IOL OD  - IOL in good position, doing well  - monitor  10. Dry Eyes OU - recommend artificial tears and lubricating ointment as needed  Ophthalmic Meds Ordered this visit:  No orders of the defined types were placed in this encounter.    No follow-ups on file.  There are no Patient Instructions on file for this visit.   Explained the diagnoses, plan, and follow up with the patient and they expressed understanding.  Patient expressed understanding of the importance of proper follow up care.   This document serves as a record of services personally performed by Gardiner Sleeper, MD, PhD. It was created on their behalf by Orvan Falconer, an ophthalmic technician. The creation of this record is the provider's dictation and/or activities during the visit.     Electronically signed by: Orvan Falconer, OA, 09/07/22  12:14 PM   Gardiner Sleeper, M.D., Ph.D. Diseases & Surgery of the Retina and [place]    Abbreviations: M  myopia (nearsighted); A astigmatism; H hyperopia (farsighted); P presbyopia; Mrx spectacle prescription;  CTL contact lenses; OD right eye; OS left eye; OU both eyes  XT exotropia; ET esotropia; PEK punctate epithelial keratitis; PEE punctate epithelial erosions; DES dry eye syndrome; MGD meibomian gland dysfunction; ATs artificial tears; PFAT's preservative free artificial tears; [place] nuclear sclerotic cataract; PSC posterior subcapsular cataract; ERM epi-retinal membrane; PVD posterior vitreous detachment; RD retinal detachment; DM diabetes mellitus; DR diabetic retinopathy; NPDR non-proliferative diabetic retinopathy; PDR proliferative diabetic retinopathy; CSME clinically significant macular edema; DME diabetic macular edema; dbh dot blot hemorrhages; CWS cotton wool spot; POAG primary open angle glaucoma; C/D cup-to-disc ratio; HVF humphrey visual field; GVF goldmann visual field; OCT optical coherence tomography; IOP intraocular pressure; BRVO Branch retinal vein occlusion; CRVO central retinal vein occlusion; CRAO central retinal artery occlusion; BRAO branch retinal artery occlusion; RT retinal tear; SB scleral buckle; PPV pars plana vitrectomy; VH Vitreous hemorrhage; PRP panretinal laser photocoagulation; IVK intravitreal kenalog; VMT vitreomacular traction; MH Macular hole;  NVD neovascularization of the disc; NVE neovascularization elsewhere; AREDS age related eye disease study; ARMD age related macular degeneration; POAG primary open angle glaucoma; EBMD epithelial/anterior basement membrane dystrophy; ACIOL anterior chamber intraocular lens; IOL intraocular lens; PCIOL posterior chamber intraocular lens; Phaco/IOL phacoemulsification with intraocular lens placement; [place] photorefractive  keratectomy; LASIK laser assisted in situ keratomileusis; HTN hypertension; DM diabetes mellitus; COPD chronic obstructive pulmonary disease

## 2022-09-09 ENCOUNTER — Ambulatory Visit (INDEPENDENT_AMBULATORY_CARE_PROVIDER_SITE_OTHER): Payer: BC Managed Care – PPO | Admitting: Ophthalmology

## 2022-09-09 ENCOUNTER — Encounter (INDEPENDENT_AMBULATORY_CARE_PROVIDER_SITE_OTHER): Payer: Self-pay | Admitting: Ophthalmology

## 2022-09-09 DIAGNOSIS — Z8669 Personal history of other diseases of the nervous system and sense organs: Secondary | ICD-10-CM

## 2022-09-09 DIAGNOSIS — H33312 Horseshoe tear of retina without detachment, left eye: Secondary | ICD-10-CM

## 2022-09-09 DIAGNOSIS — H25812 Combined forms of age-related cataract, left eye: Secondary | ICD-10-CM

## 2022-09-09 DIAGNOSIS — Z961 Presence of intraocular lens: Secondary | ICD-10-CM

## 2022-09-09 DIAGNOSIS — E119 Type 2 diabetes mellitus without complications: Secondary | ICD-10-CM

## 2022-09-09 DIAGNOSIS — H35373 Puckering of macula, bilateral: Secondary | ICD-10-CM

## 2022-09-09 DIAGNOSIS — I10 Essential (primary) hypertension: Secondary | ICD-10-CM

## 2022-09-09 DIAGNOSIS — H35341 Macular cyst, hole, or pseudohole, right eye: Secondary | ICD-10-CM

## 2022-09-09 DIAGNOSIS — H04123 Dry eye syndrome of bilateral lacrimal glands: Secondary | ICD-10-CM

## 2022-09-09 DIAGNOSIS — H35033 Hypertensive retinopathy, bilateral: Secondary | ICD-10-CM

## 2022-09-09 MED ORDER — BACITRACIN-POLYMYXIN B 500-10000 UNIT/GM OP OINT: TOPICAL_OINTMENT | Freq: Two times a day (BID) | OPHTHALMIC | 0 refills | Status: DC

## 2022-09-09 MED ORDER — DORZOLAMIDE HCL-TIMOLOL MAL 2-0.5 % OP SOLN: 1.0000 [drp] | Freq: Two times a day (BID) | OPHTHALMIC | 1 refills | Status: DC

## 2022-09-09 MED ORDER — PREDNISOLONE ACETATE 1 % OP SUSP: 1.0000 [drp] | Freq: Four times a day (QID) | OPHTHALMIC | 1 refills | Status: DC

## 2022-09-18 ENCOUNTER — Other Ambulatory Visit (INDEPENDENT_AMBULATORY_CARE_PROVIDER_SITE_OTHER): Payer: Self-pay | Admitting: Ophthalmology

## 2022-09-21 NOTE — Progress Notes (Signed)
[facility] Note  09/25/2022    CHIEF COMPLAINT Patient presents for Retina Follow Up   HISTORY OF PRESENT ILLNESS: Kenneth Perez is a 61 y.o. male who presents to the clinic today for:   HPI     Retina Follow Up   Patient presents with  Other.  In right eye.  This started weeks ago.  Severity is moderate.  Duration of 2 weeks.  Since onset it is stable.  I, the attending physician,  performed the HPI with the patient and updated documentation appropriately.        Comments   Patient feels that the right eye is doing well with no vision changes. He is using Pred OD QID and Cosopt OU BID. All drops have run out. He does not check his sugars daily.       Last edited by Bernarda Caffey, MD on 09/28/2022  2:20 AM.    Pt states he is doing okay, he is doing 30 mins of face down time every hour  Referring physician: Nolene Ebbs, MD [place],  [place] [address]  HISTORICAL INFORMATION:   Selected notes from the MEDICAL RECORD NUMBER Referred by Dr. Rona Ravens for concern of RD OD  LEE:  Ocular Hx- PMH-    CURRENT MEDICATIONS: Current Outpatient Medications (Ophthalmic Drugs)  Medication Sig   bacitracin-polymyxin b (POLYSPORIN) ophthalmic ointment PLACE INTO THE RIGHT EYE 2 (TWO) TIMES DAILY FOR 10 DAYS. PLACE A 1/2 INCH RIBBON OF OINTMENT INTO THE LOWER EYELID.   dorzolamide-timolol (COSOPT) 2-0.5 % ophthalmic solution Place 1 drop into the right eye 2 (two) times daily.   prednisoLONE acetate (PRED FORTE) 1 % ophthalmic suspension Place 1 drop into the right eye 4 (four) times daily.   No current facility-administered medications for this visit. (Ophthalmic Drugs)   Current Outpatient Medications (Other)  Medication Sig   glimepiride (AMARYL) 4 MG tablet Take 4 mg by mouth daily.   ibuprofen (ADVIL,MOTRIN) 200 MG tablet Take 200 mg by mouth every 6 (six) hours as needed for mild pain.   losartan-hydrochlorothiazide (HYZAAR)  100-12.5 MG tablet Take 1 tablet by mouth daily.   metFORMIN (GLUCOPHAGE) 500 MG tablet Take 500 mg by mouth 2 (two) times daily.   omeprazole (PRILOSEC) 20 MG capsule Take 20 mg by mouth 2 (two) times daily.   Vitamin D, Ergocalciferol, (DRISDOL) 1.25 MG (50000 UNIT) CAPS capsule Take 50,000 Units by mouth every Thursday.   No current facility-administered medications for this visit. (Other)   REVIEW OF SYSTEMS: ROS   Positive for: Endocrine, Eyes Last edited by Annie Paras, COT on 09/25/2022  9:19 AM.     ALLERGIES No Known Allergies  PAST MEDICAL HISTORY Past Medical History:  Diagnosis Date   Diabetes mellitus without complication ([place])    GERD (gastroesophageal reflux disease)    Hypertension    Past Surgical History:  Procedure Laterality Date   GAS INSERTION Right 09/03/2022   Procedure: INSERTION OF GAS;  Surgeon: Bernarda Caffey, MD;  Location: [place];  Service: Ophthalmology;  Laterality: Right;   GAS/FLUID EXCHANGE Right 09/03/2022   Procedure: GAS/FLUID EXCHANGE;  Surgeon: Bernarda Caffey, MD;  Location: [place];  Service: Ophthalmology;  Laterality: Right;   LASER PHOTO ABLATION Right 09/03/2022   Procedure: LASER PHOTO ABLATION;  Surgeon: Bernarda Caffey, MD;  Location: [place];  Service: Ophthalmology;  Laterality: Right;   MEMBRANE PEEL Right 09/03/2022   Procedure: MEMBRANE PEEL;  Surgeon: Bernarda Caffey, MD;  Location: [facility];  Service: Ophthalmology;  Laterality: Right;   PARS PLANA VITRECTOMY Right 09/03/2022   Procedure: PARS PLANA VITRECTOMY WITH 25 GAUGE;  Surgeon: Bernarda Caffey, MD;  Location: [place];  Service: Ophthalmology;  Laterality: Right;   FAMILY HISTORY History reviewed. No pertinent family history.  SOCIAL HISTORY Social History   Tobacco Use   Smoking status: Never   Smokeless tobacco: Never  Vaping Use   Vaping Use: Never used  Substance Use Topics   Alcohol use: Never   Drug use: Never       OPHTHALMIC EXAM:  Base Eye Exam      Visual Acuity (Snellen - Linear)       Right Left   Dist [place] CF at 3' 20/25 +1   Dist ph [place] NI NI         Tonometry (Tonopen, 9:23 AM)       Right Left   Pressure 8 10         Pupils       Dark Light Shape React APD   Right 3 2 Round Brisk None   Left 3 2 Round Brisk None         Visual Fields       Left Right    Full    Restrictions  Partial outer superior temporal, inferior temporal, superior nasal, inferior nasal deficiencies         Extraocular Movement       Right Left    Full, Ortho Full, Ortho         Neuro/Psych     Oriented x3: Yes   Mood/Affect: Normal         Dilation     Both eyes: 1.0% Mydriacyl, 2.5% Phenylephrine @ 9:19 AM           Slit Lamp and Fundus Exam     Slit Lamp Exam       Right Left   Lids/Lashes Dermatochalasis - upper lid Dermatochalasis - upper lid, mild MGD   Conjunctiva/Sclera Melanosis, Subconjunctival hemorrhage -- improved, sutures intact Melanosis, nasal and temporal pinguecula   Cornea mild arcus, well healed cataract wound, endo pigment, 1+ PEE, focal corneal haze superior paracentral mild arcus, 3+ fine Punctate epithelial erosions inferiorly, decreased TBUT   Anterior Chamber deep, clear, narrow temporal angle deep and clear   Iris Round and moderately dilated to 4.44m Round and well dilated   Lens PC IOL in good position with open PC (small opening) 2-3+ Nuclear sclerosis, 2-3+ Cortical cataract   Anterior Vitreous post vitrectomy, good gas fill Vitreous syneresis, no pigment         Fundus Exam       Right Left   Disc mild Pallor, Sharp rim, +cupping, +PPP, thin inferior rim trace Pallor, Sharp rim, +cupping, +PPP   C/D Ratio 0.7 0.6   Macula flat under gas; Mac hole closing Flat, ERM with lamellar pseudo hole, RPE mottling, No heme   Vessels attenuated, Tortuous attenuated, Tortuous   Periphery Attached, scattered pigmented CR scarring, supplemental laser changes posterior to peripheral CR  scars Attached, 3 small retinal holes at 0100, 3 large connected tears from 0130-0400; focal lattice degeneration at 1030 -- good laser changes surrounding all lesions           IMAGING AND PROCEDURES  Imaging and Procedures for 09/25/2022          ASSESSMENT/PLAN:    ICD-10-CM   1. Macular hole of right eye  H35.341  CANCELED: OCT, Retina - OU - Both Eyes    2. Retinal tear of left eye  H33.312     3. History of retinal detachment  Z86.69     4. Epiretinal membrane (ERM) of both eyes  H35.373     5. Diabetes mellitus type 2 without retinopathy ([place])  E11.9     6. Essential hypertension  I10     7. Hypertensive retinopathy of both eyes  H35.033     8. Combined forms of age-related cataract of left eye  H25.812     9. Pseudophakia  Z96.1     10. Dry eyes  H04.123      1. Macular hole, right eye  - pre op OCT showed full thickness macular hole with surrounding cystic changes - pre-op BCVA 20/80 from 20/40 from 20/200 - POW3 s/p PPV/TissueBlue/MP/14% C3F8 OS, 11.16.23             - doing well this morning             - retina attached and good gas bubble in place w/ mac hole closing             - IOP 10             - cont Cosopt OD BID - cont PF 4x/day OS                          PSO ung BID OS -- okay to stop             - cont face down positioning 50% of time; avoid laying flat on back              - eye shield when sleeping              - post op drop and positioning instructions reviewed              - tylenol/ibuprofen for pain  - f/u 4 weeks -- POV  2. Retinal tears, OS  - three small holes at 0100, three large retinal tears from 0130-0400 - s/p laser retinopexy OS (10.27.23) -- good laser changes surrounding all breaks - focal lattice degeneration at 1030 also treated - cont Lotemax QID OS - f/u 2-3 weeks - DFE, OCT  3. History of retinal detachment OD  - 1998 -- s/p repair in [place], then pt flew to [place] and underwent more definitive repair OD  -  retina attached OD  - stable  4. ERM OU - mild ERM OD; OS w/ lamellar hole and pucker - BCVA OD 20/80 (from macular hole); OS 20/30 - asymptomatic, no metamorphopsia - monitor for now  5. Diabetes mellitus, type 2 without retinopathy  - diagnosed with DM2 in 2022 - The incidence, risk factors for progression, natural history and treatment options for diabetic retinopathy  were discussed with patient.   - The need for close monitoring of blood glucose, blood pressure, and serum lipids, avoiding cigarette or any type of tobacco, and the need for long term follow up was also discussed with patient. - f/u in 1 year, sooner prn  6,7. Hypertensive retinopathy OU - discussed importance of tight BP control - continue to monitor  8. Mixed Cataract OS - The symptoms of cataract, surgical options, and treatments and risks were discussed with patient. - discussed diagnosis and progression - monitor for now  9. Pseudophakia OD  - s/p CE/IOL OD  -  IOL in good position, doing well  - continue to monitor  10. Dry Eyes OU - recommend artificial tears and lubricating ointment as needed  Ophthalmic Meds Ordered this visit:  Meds ordered this encounter  Medications   dorzolamide-timolol (COSOPT) 2-0.5 % ophthalmic solution    Sig: Place 1 drop into the right eye 2 (two) times daily.    Dispense:  10 mL    Refill:  1   prednisoLONE acetate (PRED FORTE) 1 % ophthalmic suspension    Sig: Place 1 drop into the right eye 4 (four) times daily.    Dispense:  10 mL    Refill:  1     Return in about 4 weeks (around 10/23/2022) for f/u [place] OD, DFE, OCT.  There are no Patient Instructions on file for this visit.   Explained the diagnoses, plan, and follow up with the patient and they expressed understanding.  Patient expressed understanding of the importance of proper follow up care.   This document serves as a record of services personally performed by Gardiner Sleeper, MD, PhD. It was created on  their behalf by San Jetty. Owens Shark, OA an ophthalmic technician. The creation of this record is the provider's dictation and/or activities during the visit.    Electronically signed by: San Jetty. Owens Shark, [place] 12.04.2023 2:20 AM  Gardiner Sleeper, M.D., Ph.D. Diseases & Surgery of the Retina and Vitreous [facility]  I have reviewed the above documentation for accuracy and completeness, and I agree with the above. Gardiner Sleeper, M.D., Ph.D. 09/28/22 2:21 AM  Abbreviations: M myopia (nearsighted); A astigmatism; H hyperopia (farsighted); P presbyopia; Mrx spectacle prescription;  CTL contact lenses; OD right eye; OS left eye; OU both eyes  XT exotropia; ET esotropia; PEK punctate epithelial keratitis; PEE punctate epithelial erosions; DES dry eye syndrome; MGD meibomian gland dysfunction; ATs artificial tears; PFAT's preservative free artificial tears; [place] nuclear sclerotic cataract; PSC posterior subcapsular cataract; ERM epi-retinal membrane; PVD posterior vitreous detachment; RD retinal detachment; DM diabetes mellitus; DR diabetic retinopathy; NPDR non-proliferative diabetic retinopathy; PDR proliferative diabetic retinopathy; CSME clinically significant macular edema; DME diabetic macular edema; dbh dot blot hemorrhages; CWS cotton wool spot; POAG primary open angle glaucoma; C/D cup-to-disc ratio; HVF humphrey visual field; GVF goldmann visual field; OCT optical coherence tomography; IOP intraocular pressure; BRVO Branch retinal vein occlusion; CRVO central retinal vein occlusion; CRAO central retinal artery occlusion; BRAO branch retinal artery occlusion; RT retinal tear; SB scleral buckle; PPV pars plana vitrectomy; VH Vitreous hemorrhage; PRP panretinal laser photocoagulation; IVK intravitreal kenalog; VMT vitreomacular traction; MH Macular hole;  NVD neovascularization of the disc; NVE neovascularization elsewhere; AREDS age related eye disease study; ARMD age related macular  degeneration; POAG primary open angle glaucoma; EBMD epithelial/anterior basement membrane dystrophy; ACIOL anterior chamber intraocular lens; IOL intraocular lens; PCIOL posterior chamber intraocular lens; Phaco/IOL phacoemulsification with intraocular lens placement; [place] photorefractive keratectomy; LASIK laser assisted in situ keratomileusis; HTN hypertension; DM diabetes mellitus; COPD chronic obstructive pulmonary disease

## 2022-09-25 ENCOUNTER — Ambulatory Visit (INDEPENDENT_AMBULATORY_CARE_PROVIDER_SITE_OTHER): Payer: BC Managed Care – PPO | Admitting: Ophthalmology

## 2022-09-25 DIAGNOSIS — H35341 Macular cyst, hole, or pseudohole, right eye: Secondary | ICD-10-CM

## 2022-09-25 DIAGNOSIS — I10 Essential (primary) hypertension: Secondary | ICD-10-CM

## 2022-09-25 DIAGNOSIS — H04123 Dry eye syndrome of bilateral lacrimal glands: Secondary | ICD-10-CM

## 2022-09-25 DIAGNOSIS — H35373 Puckering of macula, bilateral: Secondary | ICD-10-CM

## 2022-09-25 DIAGNOSIS — E119 Type 2 diabetes mellitus without complications: Secondary | ICD-10-CM

## 2022-09-25 DIAGNOSIS — Z961 Presence of intraocular lens: Secondary | ICD-10-CM

## 2022-09-25 DIAGNOSIS — H25812 Combined forms of age-related cataract, left eye: Secondary | ICD-10-CM

## 2022-09-25 DIAGNOSIS — Z8669 Personal history of other diseases of the nervous system and sense organs: Secondary | ICD-10-CM

## 2022-09-25 DIAGNOSIS — H33312 Horseshoe tear of retina without detachment, left eye: Secondary | ICD-10-CM

## 2022-09-25 DIAGNOSIS — H35033 Hypertensive retinopathy, bilateral: Secondary | ICD-10-CM

## 2022-09-25 MED ORDER — DORZOLAMIDE HCL-TIMOLOL MAL 2-0.5 % OP SOLN: 1.0000 [drp] | Freq: Two times a day (BID) | OPHTHALMIC | 1 refills | Status: AC

## 2022-09-25 MED ORDER — PREDNISOLONE ACETATE 1 % OP SUSP: 1.0000 [drp] | Freq: Four times a day (QID) | OPHTHALMIC | 1 refills | Status: AC

## 2022-09-28 ENCOUNTER — Encounter (INDEPENDENT_AMBULATORY_CARE_PROVIDER_SITE_OTHER): Payer: Self-pay | Admitting: Ophthalmology

## 2022-10-22 NOTE — Progress Notes (Addendum)
[facility] Note  10/23/2022    CHIEF COMPLAINT Patient presents for Retina Follow Up   HISTORY OF PRESENT ILLNESS: Kenneth Perez is a 62 y.o. male who presents to the clinic today for:   HPI     Retina Follow Up   Patient presents with  Other.  This started 4 weeks ago.  Duration of 4 weeks.  I, the attending physician,  performed the HPI with the patient and updated documentation appropriately.        Comments   4 week retina follow up [facility] pt is reporting that his vision has improved in the right eye he denies flashes or floaters       Last edited by Bernarda Caffey, MD on 10/25/2022 12:03 AM.     Pt states VA is better, gas bubble is still there but much smaller.   Referring physician: Nolene Ebbs, MD [place],  [place] [id number]  HISTORICAL INFORMATION:   Selected notes from the MEDICAL RECORD NUMBER Referred by Dr. Rona Ravens for concern of RD OD  LEE:  Ocular Hx- PMH-    CURRENT MEDICATIONS: Current Outpatient Medications (Ophthalmic Drugs)  Medication Sig   prednisoLONE acetate (PRED FORTE) 1 % ophthalmic suspension Place 1 drop into the left eye in the morning, at noon, and at bedtime.   dorzolamide-timolol (COSOPT) 2-0.5 % ophthalmic solution Place 1 drop into the right eye 2 (two) times daily.   prednisoLONE acetate (PRED FORTE) 1 % ophthalmic suspension Place 1 drop into the right eye 4 (four) times daily.   No current facility-administered medications for this visit. (Ophthalmic Drugs)   Current Outpatient Medications (Other)  Medication Sig   glimepiride (AMARYL) 4 MG tablet Take 4 mg by mouth daily.   ibuprofen (ADVIL,MOTRIN) 200 MG tablet Take 200 mg by mouth every 6 (six) hours as needed for mild pain.   losartan-hydrochlorothiazide (HYZAAR) 100-12.5 MG tablet Take 1 tablet by mouth daily.   metFORMIN (GLUCOPHAGE) 500 MG tablet Take 500 mg by mouth 2 (two) times daily.   omeprazole (PRILOSEC) 20 MG capsule  Take 20 mg by mouth 2 (two) times daily.   Vitamin D, Ergocalciferol, (DRISDOL) 1.25 MG (50000 UNIT) CAPS capsule Take 50,000 Units by mouth every Thursday.   No current facility-administered medications for this visit. (Other)   REVIEW OF SYSTEMS: ROS   Positive for: Eyes Last edited by Bernarda Caffey, MD on 10/25/2022 12:04 AM.     ALLERGIES No Known Allergies  PAST MEDICAL HISTORY Past Medical History:  Diagnosis Date   Diabetes mellitus without complication ([place])    GERD (gastroesophageal reflux disease)    Hypertension    Past Surgical History:  Procedure Laterality Date   GAS INSERTION Right 09/03/2022   Procedure: INSERTION OF GAS;  Surgeon: Bernarda Caffey, MD;  Location: [place];  Service: Ophthalmology;  Laterality: Right;   GAS/FLUID EXCHANGE Right 09/03/2022   Procedure: GAS/FLUID EXCHANGE;  Surgeon: Bernarda Caffey, MD;  Location: [place];  Service: Ophthalmology;  Laterality: Right;   LASER PHOTO ABLATION Right 09/03/2022   Procedure: LASER PHOTO ABLATION;  Surgeon: Bernarda Caffey, MD;  Location: [place];  Service: Ophthalmology;  Laterality: Right;   MEMBRANE PEEL Right 09/03/2022   Procedure: MEMBRANE PEEL;  Surgeon: Bernarda Caffey, MD;  Location: [place];  Service: Ophthalmology;  Laterality: Right;   PARS PLANA VITRECTOMY Right 09/03/2022   Procedure: PARS PLANA VITRECTOMY WITH 25 GAUGE;  Surgeon: Bernarda Caffey, MD;  Location: [place];  Service: Ophthalmology;  Laterality: Right;   FAMILY HISTORY History reviewed. No pertinent family history.  SOCIAL HISTORY Social History   Tobacco Use   Smoking status: Never   Smokeless tobacco: Never  Vaping Use   Vaping Use: Never used  Substance Use Topics   Alcohol use: Never   Drug use: Never       OPHTHALMIC EXAM:  Base Eye Exam     Visual Acuity (Snellen - Linear)       Right Left   Dist [place] 20/200 20/30   Dist ph [place] 20/150          Tonometry (Tonopen, 9:29 AM)       Right Left   Pressure 12 11          Pupils       Pupils Dark Light Shape React APD   Right PERRL 3 2 Round Brisk None   Left PERRL 3 2 Round Brisk None         Visual Fields       Left Right    Full    Restrictions  Partial outer superior temporal, inferior temporal, superior nasal, inferior nasal deficiencies         Neuro/Psych     Oriented x3: Yes   Mood/Affect: Normal         Dilation     Both eyes: 2.5% Phenylephrine @ 9:29 AM           Slit Lamp and Fundus Exam     Slit Lamp Exam       Right Left   Lids/Lashes Dermatochalasis - upper lid Dermatochalasis - upper lid, mild MGD   Conjunctiva/Sclera Melanosis, sutures dissolved Melanosis, nasal and temporal pinguecula   Cornea mild arcus, well healed cataract wound, endo pigment, 1+ PEE, focal corneal haze superior paracentral mild arcus, 1-2+ fine Punctate epithelial erosions inferiorly   Anterior Chamber deep, clear, narrow temporal angle deep and clear   Iris Round and moderately dilated to 4.22m Round and well dilated   Lens PC IOL in good position with open PC (small opening) 2-3+ Nuclear sclerosis, 2-3+ Cortical cataract   Anterior Vitreous post vitrectomy, 25% gas bubble Vitreous syneresis, no pigment         Fundus Exam       Right Left   Disc mild Pallor, Sharp rim, +cupping, +PPP, thin inferior rim trace Pallor, Sharp rim, +cupping, +PPP   C/D Ratio 0.7 0.6   Macula flat under gas; Mac hole closed; good foveal reflex, RPE mottling, no edema Flat, ERM with lamellar pseudo hole, RPE mottling, No heme, mild strae   Vessels attenuated, Tortuous attenuated, Tortuous   Periphery Attached, scattered pigmented CR scarring, supplemental laser changes posterior to peripheral CR scars Attached, 3 small retinal holes at 0100, 3 large connected tears from 0130-0400; focal lattice degeneration at 1030 -- good laser changes surrounding all lesions; no new RT/RD           IMAGING AND PROCEDURES  Imaging and Procedures for 10/23/2022  OCT,  Retina - OU - Both Eyes       Right Eye Quality was good. Central Foveal Thickness: 220. Progression has improved. Findings include no IRF, abnormal foveal contour, subretinal hyper-reflective material, outer retinal atrophy (Mac hole closed; central ellipsoid loss; trace vitreous opacities ).   Left Eye Quality was good. Central Foveal Thickness: 408. Progression has been stable. Findings include no IRF, no SRF, abnormal foveal contour, epiretinal membrane, lamellar hole, macular pucker.  Notes *Images captured and stored on drive  Diagnosis / Impression:  OD: Mac hole closed; central ellipsoid loss; trace vitreous opacities  OS: ERM w/ pucker and lamellar hole; no IRF/SRF centrally -- stable  Clinical management:  See below  Abbreviations: NFP - Normal foveal profile. CME - cystoid macular edema. PED - pigment epithelial detachment. IRF - intraretinal fluid. SRF - subretinal fluid. EZ - ellipsoid zone. ERM - epiretinal membrane. ORA - outer retinal atrophy. ORT - outer retinal tubulation. SRHM - subretinal hyper-reflective material. IRHM - intraretinal hyper-reflective material            ASSESSMENT/PLAN:    ICD-10-CM   1. Macular hole of right eye  H35.341 OCT, Retina - OU - Both Eyes    2. Retinal tear of left eye  H33.312     3. History of retinal detachment  Z86.69     4. Epiretinal membrane (ERM) of both eyes  H35.373     5. Diabetes mellitus type 2 without retinopathy ([place])  E11.9     6. Essential hypertension  I10     7. Hypertensive retinopathy of both eyes  H35.033     8. Combined forms of age-related cataract of left eye  H25.812     9. Pseudophakia  Z96.1     10. Dry eyes  H04.123      1. Macular hole, right eye  - pre op OCT showed full thickness macular hole with surrounding cystic changes - pre-op BCVA 20/80 from 20/40 from 20/200 - POW3 s/p PPV/TissueBlue/MP/14% C3F8 OS, 11.16.23             - doing well             - retina attached and mac  hole closed  - gas bubble 25%  - BCVA 20/150             - IOP 11 today              - cont Cosopt OD BID--decrease to QD PF 4x/day OS-decrease to TID PSO ung BID OS -- okay to stop             - No longer needs to face down but still avoid laying flat on back until gas bubble is resolved.              - d/c eye shield             - post op drop and positioning instructions reviewed              - tylenol/ibuprofen for pain  - f/u 4 weeks -- POV  2. 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 History of retinal detachment OD  - 1998 -- s/p repair in [place], then pt flew to [place] and underwent more definitive repair OD  - retina attached OD  - stable  4. ERM OU - mild ERM OD; OS w/ lamellar hole and pucker - BCVA OD 20/80 (from macular hole); OS 20/30 - asymptomatic, no metamorphopsia - monitor for now  5. Diabetes mellitus, type 2 without retinopathy  - diagnosed with DM2 in 2022 - The incidence, risk factors for progression, natural history and treatment options for diabetic retinopathy  were discussed with patient.   - The need for  close monitoring of blood glucose, blood pressure, and serum lipids, avoiding cigarette or any type of tobacco, and the need for long term follow up was also discussed with patient. - f/u in 1 year, sooner prn  6,7. Hypertensive retinopathy OU - discussed importance of tight BP control - continue to monitor  8. Mixed Cataract OS - The symptoms of cataract, surgical options, and treatments and risks were discussed with patient. - discussed diagnosis and progression - monitor for now  9. Pseudophakia OD  - s/p CE/IOL OD  - IOL in good position, doing well  - continue to monitor  10. Dry Eyes OU - recommend artificial tears and lubricating ointment as  needed  Ophthalmic Meds Ordered this visit:  Meds ordered this encounter  Medications   prednisoLONE acetate (PRED FORTE) 1 % ophthalmic suspension    Sig: Place 1 drop into the left eye in the morning, at noon, and at bedtime.    Dispense:  10 mL    Refill:  2     Return in about 4 weeks (around 11/20/2022) for 4 weeks, mac hole OD.  There are no Patient Instructions on file for this visit.   This document serves as a record of services personally performed by Gardiner Sleeper, MD, PhD. It was created on their behalf by Bernarda Caffey, MD, an ophthalmic technician. The creation of this record is the provider's dictation and/or activities during the visit.    Electronically signed by: Bernarda Caffey, MD 1/04/202412:10 AM  Gardiner Sleeper, M.D., Ph.D. Diseases & Surgery of the Retina and Vitreous [facility]  I have reviewed the above documentation for accuracy and completeness, and I agree with the above. Gardiner Sleeper, M.D., Ph.D. 10/25/22 12:10 AM   Abbreviations: M myopia (nearsighted); A astigmatism; H hyperopia (farsighted); P presbyopia; Mrx spectacle prescription;  CTL contact lenses; OD right eye; OS left eye; OU both eyes  XT exotropia; ET esotropia; PEK punctate epithelial keratitis; PEE punctate epithelial erosions; DES dry eye syndrome; MGD meibomian gland dysfunction; ATs artificial tears; PFAT's preservative free artificial tears; [place] nuclear sclerotic cataract; PSC posterior subcapsular cataract; ERM epi-retinal membrane; PVD posterior vitreous detachment; RD retinal detachment; DM diabetes mellitus; DR diabetic retinopathy; NPDR non-proliferative diabetic retinopathy; PDR proliferative diabetic retinopathy; CSME clinically significant macular edema; DME diabetic macular edema; dbh dot blot hemorrhages; CWS cotton wool spot; POAG primary open angle glaucoma; C/D cup-to-disc ratio; HVF humphrey visual field; GVF goldmann visual field; OCT optical coherence  tomography; IOP intraocular pressure; BRVO Branch retinal vein occlusion; CRVO central retinal vein occlusion; CRAO central retinal artery occlusion; BRAO branch retinal artery occlusion; RT retinal tear; SB scleral buckle; PPV pars plana vitrectomy; VH Vitreous hemorrhage; PRP panretinal laser photocoagulation; IVK intravitreal kenalog; VMT vitreomacular traction; MH Macular hole;  NVD neovascularization of the disc; NVE neovascularization elsewhere; AREDS age related eye disease study; ARMD age related macular degeneration; POAG primary open angle glaucoma; EBMD epithelial/anterior basement membrane dystrophy; ACIOL anterior chamber intraocular lens; IOL intraocular lens; PCIOL posterior chamber intraocular lens; Phaco/IOL phacoemulsification with intraocular lens placement; [place] photorefractive keratectomy; LASIK laser assisted in situ keratomileusis; HTN hypertension; DM diabetes mellitus; COPD chronic obstructive pulmonary disease

## 2022-10-23 ENCOUNTER — Ambulatory Visit (INDEPENDENT_AMBULATORY_CARE_PROVIDER_SITE_OTHER): Payer: BC Managed Care – PPO | Admitting: Ophthalmology

## 2022-10-23 DIAGNOSIS — Z8669 Personal history of other diseases of the nervous system and sense organs: Secondary | ICD-10-CM

## 2022-10-23 DIAGNOSIS — H35341 Macular cyst, hole, or pseudohole, right eye: Secondary | ICD-10-CM

## 2022-10-23 DIAGNOSIS — H35033 Hypertensive retinopathy, bilateral: Secondary | ICD-10-CM | POA: Diagnosis not present

## 2022-10-23 DIAGNOSIS — I10 Essential (primary) hypertension: Secondary | ICD-10-CM | POA: Diagnosis not present

## 2022-10-23 DIAGNOSIS — H35373 Puckering of macula, bilateral: Secondary | ICD-10-CM

## 2022-10-23 DIAGNOSIS — H04123 Dry eye syndrome of bilateral lacrimal glands: Secondary | ICD-10-CM

## 2022-10-23 DIAGNOSIS — E119 Type 2 diabetes mellitus without complications: Secondary | ICD-10-CM

## 2022-10-23 DIAGNOSIS — H25812 Combined forms of age-related cataract, left eye: Secondary | ICD-10-CM

## 2022-10-23 DIAGNOSIS — H33312 Horseshoe tear of retina without detachment, left eye: Secondary | ICD-10-CM

## 2022-10-23 DIAGNOSIS — Z961 Presence of intraocular lens: Secondary | ICD-10-CM

## 2022-10-23 MED ORDER — PREDNISOLONE ACETATE 1 % OP SUSP: 1.0000 [drp] | Freq: Three times a day (TID) | OPHTHALMIC | 2 refills | Status: DC

## 2022-10-25 ENCOUNTER — Encounter (INDEPENDENT_AMBULATORY_CARE_PROVIDER_SITE_OTHER): Payer: Self-pay | Admitting: Ophthalmology

## 2022-11-03 DIAGNOSIS — K219 Gastro-esophageal reflux disease without esophagitis: Secondary | ICD-10-CM | POA: Diagnosis not present

## 2022-11-03 DIAGNOSIS — M179 Osteoarthritis of knee, unspecified: Secondary | ICD-10-CM | POA: Diagnosis not present

## 2022-11-03 DIAGNOSIS — E1165 Type 2 diabetes mellitus with hyperglycemia: Secondary | ICD-10-CM | POA: Diagnosis not present

## 2022-11-03 DIAGNOSIS — I10 Essential (primary) hypertension: Secondary | ICD-10-CM | POA: Diagnosis not present

## 2022-11-16 DIAGNOSIS — E1165 Type 2 diabetes mellitus with hyperglycemia: Secondary | ICD-10-CM | POA: Diagnosis not present

## 2022-11-16 DIAGNOSIS — K219 Gastro-esophageal reflux disease without esophagitis: Secondary | ICD-10-CM | POA: Diagnosis not present

## 2022-11-16 DIAGNOSIS — I10 Essential (primary) hypertension: Secondary | ICD-10-CM | POA: Diagnosis not present

## 2022-11-19 NOTE — Progress Notes (Signed)
[facility] Note  11/20/2022    CHIEF COMPLAINT Patient presents for Retina Follow Up   HISTORY OF PRESENT ILLNESS: Kenneth Perez is a 62 y.o. male who presents to the clinic today for:   HPI     Retina Follow Up   Patient presents with  Retinal Break/Detachment.  In right eye.  Severity is moderate.  Duration of 4 weeks.  Since onset it is gradually improving.  I, the attending physician,  performed the HPI with the patient and updated documentation appropriately.        Comments   Pt here for 4 wk ret f/u for mac hole OD. Pt states VA has improved, isnt seeing anymore floaters in OD. Pt reports taking Cosopt QD OD and PF TID OD. PT requested list of recommended AT gtts.       Last edited by Bernarda Caffey, MD on 11/20/2022  1:16 PM.    Patient states vision has improved OD.Not seeing floaters OD.  Referring physician: Nolene Ebbs, MD [place],  [place] [address]  HISTORICAL INFORMATION:   Selected notes from the MEDICAL RECORD NUMBER Referred by Dr. Rona Ravens for concern of RD OD  LEE:  Ocular Hx- PMH-    CURRENT MEDICATIONS: Current Outpatient Medications (Ophthalmic Drugs)  Medication Sig   dorzolamide-timolol (COSOPT) 2-0.5 % ophthalmic solution Place 1 drop into the right eye 2 (two) times daily.   prednisoLONE acetate (PRED FORTE) 1 % ophthalmic suspension Place 1 drop into the right eye 4 (four) times daily.   prednisoLONE acetate (PRED FORTE) 1 % ophthalmic suspension Place 1 drop into the left eye in the morning, at noon, and at bedtime.   No current facility-administered medications for this visit. (Ophthalmic Drugs)   Current Outpatient Medications (Other)  Medication Sig   glimepiride (AMARYL) 4 MG tablet Take 4 mg by mouth daily.   ibuprofen (ADVIL,MOTRIN) 200 MG tablet Take 200 mg by mouth every 6 (six) hours as needed for mild pain.   losartan-hydrochlorothiazide (HYZAAR) 100-12.5 MG tablet Take 1 tablet by  mouth daily.   metFORMIN (GLUCOPHAGE) 500 MG tablet Take 500 mg by mouth 2 (two) times daily.   omeprazole (PRILOSEC) 20 MG capsule Take 20 mg by mouth 2 (two) times daily.   Vitamin D, Ergocalciferol, (DRISDOL) 1.25 MG (50000 UNIT) CAPS capsule Take 50,000 Units by mouth every Thursday.   No current facility-administered medications for this visit. (Other)   REVIEW OF SYSTEMS: ROS   Positive for: Cardiovascular, Eyes Negative for: Constitutional, Gastrointestinal, Neurological, Skin, Genitourinary, Musculoskeletal, HENT, Endocrine, Respiratory, Psychiatric, Allergic/Imm, Heme/Lymph Last edited by Kingsley Spittle, COT on 11/20/2022  9:14 AM.     ALLERGIES No Known Allergies  PAST MEDICAL HISTORY Past Medical History:  Diagnosis Date   Diabetes mellitus without complication ([place])    GERD (gastroesophageal reflux disease)    Hypertension    Past Surgical History:  Procedure Laterality Date   GAS INSERTION Right 09/03/2022   Procedure: INSERTION OF GAS;  Surgeon: Bernarda Caffey, MD;  Location: [facility];  Service: Ophthalmology;  Laterality: Right;   GAS/FLUID EXCHANGE Right 09/03/2022   Procedure: GAS/FLUID EXCHANGE;  Surgeon: Bernarda Caffey, MD;  Location: [facility];  Service: Ophthalmology;  Laterality: Right;   LASER PHOTO ABLATION Right 09/03/2022   Procedure: LASER PHOTO ABLATION;  Surgeon: Bernarda Caffey, MD;  Location: [facility];  Service: Ophthalmology;  Laterality: Right;   MEMBRANE PEEL Right 09/03/2022   Procedure: MEMBRANE PEEL;  Surgeon: Coralyn Pear,  Aaron Edelman, MD;  Location: [facility];  Service: Ophthalmology;  Laterality: Right;   PARS PLANA VITRECTOMY Right 09/03/2022   Procedure: PARS PLANA VITRECTOMY WITH 25 GAUGE;  Surgeon: Bernarda Caffey, MD;  Location: [facility];  Service: Ophthalmology;  Laterality: Right;   FAMILY HISTORY History reviewed. No pertinent family history.  SOCIAL HISTORY Social History   Tobacco Use   Smoking status: Never   Smokeless tobacco: Never  Vaping Use    Vaping Use: Never used  Substance Use Topics   Alcohol use: Never   Drug use: Never       OPHTHALMIC EXAM:  Base Eye Exam     Visual Acuity (Snellen - Linear)       Right Left   Dist [place] 20/200 +2 20/30 -2   Dist ph [place] 20/100 -2 NI         Tonometry (Tonopen, 9:20 AM)       Right Left   Pressure 15 15         Pupils       Pupils Dark Light Shape React APD   Right PERRL 3 2 Round Brisk None   Left PERRL 3 2 Round Brisk None         Visual Fields (Counting fingers)       Left Right    Full    Restrictions  Partial outer superior temporal, inferior temporal, superior nasal, inferior nasal deficiencies         Extraocular Movement       Right Left    Full, Ortho Full, Ortho         Neuro/Psych     Oriented x3: Yes   Mood/Affect: Normal         Dilation     Both eyes: 1.0% Mydriacyl, 2.5% Phenylephrine @ 9:21 AM           Slit Lamp and Fundus Exam     Slit Lamp Exam       Right Left   Lids/Lashes Dermatochalasis - upper lid Dermatochalasis - upper lid, mild MGD   Conjunctiva/Sclera Melanosis, sutures dissolved Melanosis, nasal and temporal pinguecula   Cornea mild arcus, well healed cataract wound, endo pigment--improved, 1+ PEE, focal corneal haze superior paracentral mild arcus, 1-2+ fine Punctate epithelial erosions inferiorly   Anterior Chamber deep, clear, narrow temporal angle deep and clear   Iris Round and moderately dilated to 4.24m Round and well dilated   Lens PC IOL in good position with open PC (small opening) 2-3+ Nuclear sclerosis, 2-3+ Cortical cataract   Anterior Vitreous post vitrectomy, gas bubble gone Vitreous syneresis, no pigment         Fundus Exam       Right Left   Disc mild Pallor, Sharp rim, +cupping, +PPP, thin inferior rim trace Pallor, Sharp rim, +cupping, +PPP   C/D Ratio 0.7 0.7   Macula flat; Mac hole closed; good foveal reflex, RPE mottling, no edema Flat, ERM with lamellar pseudo hole, RPE  mottling, No heme, mild strae   Vessels attenuated, Tortuous attenuated, Tortuous   Periphery Attached, scattered pigmented CR scarring, supplemental laser changes posterior to peripheral CR scars Attached, 3 small retinal holes at 0100, 3 large connected tears from 0130-0400; focal lattice degeneration at 1030 -- good laser changes surrounding all lesions; no new RT/RD           IMAGING AND PROCEDURES  Imaging and Procedures for 11/20/2022  OCT, Retina - OU - Both Eyes  Right Eye Quality was good. Central Foveal Thickness: 210. Progression has improved. Findings include no IRF, abnormal foveal contour, subretinal hyper-reflective material, outer retinal atrophy (Mac hole closed; mild interval improvement in central ellipsoid signal; trace vitreous opacities--improved).   Left Eye Quality was good. Central Foveal Thickness: 417. Progression has been stable. Findings include no IRF, no SRF, abnormal foveal contour, epiretinal membrane, lamellar hole, macular pucker.   Notes *Images captured and stored on drive  Diagnosis / Impression:  OD: Mac hole closed;interval improvement in central ellipsoid signal; trace vitreous opacities--improved  OS: ERM w/ pucker and lamellar hole; no IRF/SRF centrally -- stable  Clinical management:  See below  Abbreviations: NFP - Normal foveal profile. CME - cystoid macular edema. PED - pigment epithelial detachment. IRF - intraretinal fluid. SRF - subretinal fluid. EZ - ellipsoid zone. ERM - epiretinal membrane. ORA - outer retinal atrophy. ORT - outer retinal tubulation. SRHM - subretinal hyper-reflective material. IRHM - intraretinal hyper-reflective material            ASSESSMENT/PLAN:    ICD-10-CM   1. Macular hole of right eye  H35.341 OCT, Retina - OU - Both Eyes    2. Retinal tear of left eye  H33.312     3. History of retinal detachment  Z86.69     4. Epiretinal membrane (ERM) of both eyes  H35.373     5. Diabetes mellitus  type 2 without retinopathy ([place])  E11.9     6. Essential hypertension  I10     7. Hypertensive retinopathy of both eyes  H35.033     8. Combined forms of age-related cataract of left eye  H25.812     9. Pseudophakia  Z96.1     10. Dry eyes  H04.123      1. Macular hole, right eye  - pre op OCT showed full thickness macular hole with surrounding cystic changes - pre-op BCVA 20/80 from 20/200 - POW11 s/p PPV/TissueBlue/MP/14% C3F8 OD, 11.16.23             - doing well             - retina attached and mac hole closed  - gas bubble gone  - BCVA 20/100-2             - IOP 15 today              - cont Cosopt qd OD PF use qd OD for 1 week then stop             - d/c positioning             - post op drop instructions reviewed   - f/u 2-3 months DFE, OCT  2. 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 History of retinal detachment OD  - 1998 -- s/p repair in [place], then pt flew to [place] and underwent more definitive repair OD  - retina attached OD  - stable  4. ERM OU - mild ERM OD; OS w/ lamellar hole and pucker - BCVA OD 20/80 (from macular hole); OS 20/30 - asymptomatic, no metamorphopsia - monitor for now  5. Diabetes mellitus, type 2 without retinopathy  - diagnosed with DM2 in 2022 - The incidence, risk factors for progression, natural history and treatment options for  diabetic retinopathy  were discussed with patient.   - The need for close monitoring of blood glucose, blood pressure, and serum lipids, avoiding cigarette or any type of tobacco, and the need for long term follow up was also discussed with patient. - f/u in 1 year, sooner prn  6,7. Hypertensive retinopathy OU - discussed importance of tight BP control - continue to monitor  8. Mixed Cataract OS - The  symptoms of cataract, surgical options, and treatments and risks were discussed with patient. - discussed diagnosis and progression - will refer for cataract surgery - clear from a retina standpoint to proceed with cataract surgery when pt and surgeon are ready   9. Pseudophakia OD  - s/p CE/IOL OD  - IOL in good position, doing well  - continue to monitor  10. Dry Eyes OU - recommend artificial tears and lubricating ointment as needed  Ophthalmic Meds Ordered this visit:  No orders of the defined types were placed in this encounter.    Return for 2-3 mos - mac hole OD - DFE, OCT.  There are no Patient Instructions on file for this visit.  This document serves as a record of services personally performed by Gardiner Sleeper, MD, PhD. It was created on their behalf by Joetta Manners COT, an ophthalmic technician. The creation of this record is the provider's dictation and/or activities during the visit.    Electronically signed by: Joetta Manners COT 11/19/2022 1:16 PM  This document serves as a record of services personally performed by Gardiner Sleeper, MD, PhD. It was created on their behalf by Roselee Nova, COMT. The creation of this record is the provider's dictation and/or activities during the visit.  Electronically signed by: Roselee Nova, COMT 11/20/22 1:16 PM  Gardiner Sleeper, M.D., Ph.D. Diseases & Surgery of the Retina and [place] 11/20/2022   I have reviewed the above documentation for accuracy and completeness, and I agree with the above. Gardiner Sleeper, M.D., Ph.D. 11/20/22 1:21 PM  Abbreviations: M myopia (nearsighted); A astigmatism; H hyperopia (farsighted); P presbyopia; Mrx spectacle prescription;  CTL contact lenses; OD right eye; OS left eye; OU both eyes  XT exotropia; ET esotropia; PEK punctate epithelial keratitis; PEE punctate epithelial erosions; DES dry eye syndrome; MGD meibomian gland dysfunction; ATs  artificial tears; PFAT's preservative free artificial tears; [place] nuclear sclerotic cataract; PSC posterior subcapsular cataract; ERM epi-retinal membrane; PVD posterior vitreous detachment; RD retinal detachment; DM diabetes mellitus; DR diabetic retinopathy; NPDR non-proliferative diabetic retinopathy; PDR proliferative diabetic retinopathy; CSME clinically significant macular edema; DME diabetic macular edema; dbh dot blot hemorrhages; CWS cotton wool spot; POAG primary open angle glaucoma; C/D cup-to-disc ratio; HVF humphrey visual field; GVF goldmann visual field; OCT optical coherence tomography; IOP intraocular pressure; BRVO Branch retinal vein occlusion; CRVO central retinal vein occlusion; CRAO central retinal artery occlusion; BRAO branch retinal artery occlusion; RT retinal tear; SB scleral buckle; PPV pars plana vitrectomy; VH Vitreous hemorrhage; PRP panretinal laser photocoagulation; IVK intravitreal kenalog; VMT vitreomacular traction; MH Macular hole;  NVD neovascularization of the disc; NVE neovascularization elsewhere; AREDS age related eye disease study; ARMD age related macular degeneration; POAG primary open angle glaucoma; EBMD epithelial/anterior basement membrane dystrophy; ACIOL anterior chamber intraocular lens; IOL intraocular lens; PCIOL posterior chamber intraocular lens; Phaco/IOL phacoemulsification with intraocular lens placement; [place] photorefractive keratectomy; LASIK laser assisted in situ keratomileusis; HTN hypertension; DM diabetes mellitus; COPD chronic obstructive pulmonary disease

## 2022-11-20 ENCOUNTER — Ambulatory Visit (INDEPENDENT_AMBULATORY_CARE_PROVIDER_SITE_OTHER): Payer: BC Managed Care – PPO | Admitting: Ophthalmology

## 2022-11-20 ENCOUNTER — Encounter (INDEPENDENT_AMBULATORY_CARE_PROVIDER_SITE_OTHER): Payer: Self-pay | Admitting: Ophthalmology

## 2022-11-20 DIAGNOSIS — H33312 Horseshoe tear of retina without detachment, left eye: Secondary | ICD-10-CM

## 2022-11-20 DIAGNOSIS — H35033 Hypertensive retinopathy, bilateral: Secondary | ICD-10-CM | POA: Diagnosis not present

## 2022-11-20 DIAGNOSIS — Z961 Presence of intraocular lens: Secondary | ICD-10-CM

## 2022-11-20 DIAGNOSIS — I10 Essential (primary) hypertension: Secondary | ICD-10-CM | POA: Diagnosis not present

## 2022-11-20 DIAGNOSIS — H25812 Combined forms of age-related cataract, left eye: Secondary | ICD-10-CM

## 2022-11-20 DIAGNOSIS — H35341 Macular cyst, hole, or pseudohole, right eye: Secondary | ICD-10-CM

## 2022-11-20 DIAGNOSIS — H35373 Puckering of macula, bilateral: Secondary | ICD-10-CM | POA: Diagnosis not present

## 2022-11-20 DIAGNOSIS — H04123 Dry eye syndrome of bilateral lacrimal glands: Secondary | ICD-10-CM

## 2022-11-20 DIAGNOSIS — E119 Type 2 diabetes mellitus without complications: Secondary | ICD-10-CM

## 2022-11-20 DIAGNOSIS — Z8669 Personal history of other diseases of the nervous system and sense organs: Secondary | ICD-10-CM

## 2023-01-11 NOTE — Progress Notes (Signed)
[facility] Note  01/18/2023    CHIEF COMPLAINT Patient presents for Retina Follow Up   HISTORY OF PRESENT ILLNESS: Kenneth Perez is a 62 y.o. male who presents to the clinic today for:   HPI     Retina Follow Up   Patient presents with  Other.  In right eye.  This started 3 months ago.  I, the attending physician,  performed the HPI with the patient and updated documentation appropriately.        Comments   Patient here for 3 months retina follow up for  macular hole OD. Patient states vision doing good. No eye pain. Eyes red and dry. Still using drops.       Last edited by Bernarda Caffey, MD on 01/18/2023  4:29 PM.    Pt states his eyes are dry and red, he is still using Cosopt once a day in the right eye  Referring physician: Nolene Ebbs, MD [place],  [place] [address]  HISTORICAL INFORMATION:   Selected notes from the MEDICAL RECORD NUMBER Referred by Dr. Rona Ravens for concern of RD OD  LEE:  Ocular Hx- PMH-    CURRENT MEDICATIONS: Current Outpatient Medications (Ophthalmic Drugs)  Medication Sig   dorzolamide-timolol (COSOPT) 2-0.5 % ophthalmic solution Place 1 drop into the right eye 2 (two) times daily.   prednisoLONE acetate (PRED FORTE) 1 % ophthalmic suspension Place 1 drop into the right eye 4 (four) times daily.   prednisoLONE acetate (PRED FORTE) 1 % ophthalmic suspension Place 1 drop into the left eye in the morning, at noon, and at bedtime.   No current facility-administered medications for this visit. (Ophthalmic Drugs)   Current Outpatient Medications (Other)  Medication Sig   glimepiride (AMARYL) 4 MG tablet Take 4 mg by mouth daily.   ibuprofen (ADVIL,MOTRIN) 200 MG tablet Take 200 mg by mouth every 6 (six) hours as needed for mild pain.   losartan-hydrochlorothiazide (HYZAAR) 100-12.5 MG tablet Take 1 tablet by mouth daily.   metFORMIN (GLUCOPHAGE) 500 MG tablet Take 500 mg by mouth 2 (two) times daily.    omeprazole (PRILOSEC) 20 MG capsule Take 20 mg by mouth 2 (two) times daily.   Vitamin D, Ergocalciferol, (DRISDOL) 1.25 MG (50000 UNIT) CAPS capsule Take 50,000 Units by mouth every Thursday.   No current facility-administered medications for this visit. (Other)   REVIEW OF SYSTEMS: ROS   Positive for: Endocrine, Cardiovascular, Eyes Negative for: Constitutional, Gastrointestinal, Neurological, Skin, Genitourinary, Musculoskeletal, HENT, Respiratory, Psychiatric, Allergic/Imm, Heme/Lymph Last edited by Theodore Demark, COA on 01/18/2023  3:15 PM.      ALLERGIES No Known Allergies  PAST MEDICAL HISTORY Past Medical History:  Diagnosis Date   Diabetes mellitus without complication    GERD (gastroesophageal reflux disease)    Hypertension    Past Surgical History:  Procedure Laterality Date   GAS INSERTION Right 09/03/2022   Procedure: INSERTION OF GAS;  Surgeon: Bernarda Caffey, MD;  Location: [place];  Service: Ophthalmology;  Laterality: Right;   GAS/FLUID EXCHANGE Right 09/03/2022   Procedure: GAS/FLUID EXCHANGE;  Surgeon: Bernarda Caffey, MD;  Location: [place];  Service: Ophthalmology;  Laterality: Right;   LASER PHOTO ABLATION Right 09/03/2022   Procedure: LASER PHOTO ABLATION;  Surgeon: Bernarda Caffey, MD;  Location: [place];  Service: Ophthalmology;  Laterality: Right;   MEMBRANE PEEL Right 09/03/2022   Procedure: MEMBRANE PEEL;  Surgeon: Bernarda Caffey, MD;  Location: [place];  Service: Ophthalmology;  Laterality:  Right;   PARS PLANA VITRECTOMY Right 09/03/2022   Procedure: PARS PLANA VITRECTOMY WITH 25 GAUGE;  Surgeon: Bernarda Caffey, MD;  Location: [place];  Service: Ophthalmology;  Laterality: Right;   FAMILY HISTORY History reviewed. No pertinent family history.  SOCIAL HISTORY Social History   Tobacco Use   Smoking status: Never   Smokeless tobacco: Never  Vaping Use   Vaping Use: Never used  Substance Use Topics   Alcohol use: Never   Drug use: Never        OPHTHALMIC EXAM:  Base Eye Exam     Visual Acuity (Snellen - Linear)       Right Left   Dist [place] 20/100 -2 20/30 -2   Dist ph [place] 20/100 +1          Tonometry (Tonopen, 3:12 PM)       Right Left   Pressure 18 16         Pupils       Dark Light Shape React APD   Right 3 2 Round Brisk None   Left 3 2 Round Brisk None         Visual Fields (Counting fingers)       Left Right    Full    Restrictions  Partial outer superior temporal, inferior temporal, superior nasal, inferior nasal deficiencies         Extraocular Movement       Right Left    Full, Ortho Full, Ortho         Neuro/Psych     Oriented x3: Yes   Mood/Affect: Normal         Dilation     Both eyes: 1.0% Mydriacyl, 2.5% Phenylephrine @ 3:12 PM           Slit Lamp and Fundus Exam     Slit Lamp Exam       Right Left   Lids/Lashes Dermatochalasis - upper lid Dermatochalasis - upper lid, mild MGD   Conjunctiva/Sclera Melanosis, 1+ Injection nasally Melanosis, nasal and temporal pinguecula   Cornea mild arcus, well healed cataract wound, endo pigment -- improved, 1+ PEE, focal corneal haze superior paracentral mild arcus, 2+ fine Punctate epithelial erosions inferiorly   Anterior Chamber deep, clear, narrow temporal angle deep and clear   Iris Round and moderately dilated to 4.43mm Round and well dilated   Lens PC IOL in good position with open PC (small opening) 2-3+ Nuclear sclerosis with brunescence, 2-3+ Cortical cataract   Anterior Vitreous post vitrectomy, gas bubble gone Vitreous syneresis, no pigment         Fundus Exam       Right Left   Disc mild Pallor, Sharp rim, +cupping, +PPP, thin inferior rim trace Pallor, Sharp rim, +cupping, +PPP   C/D Ratio 0.7 0.7   Macula flat; Mac hole closed; good foveal reflex, RPE mottling, no edema Flat, ERM with lamellar pseudo hole, RPE mottling, No heme, mild strae   Vessels attenuated, Tortuous attenuated, Tortuous   Periphery  Attached, scattered pigmented CR scarring, supplemental laser changes posterior to peripheral CR scars Attached, 3 small retinal holes at 0100, 3 large connected tears from 0130-0400; focal lattice degeneration at 1030 -- good laser changes surrounding all lesions; no new RT/RD           IMAGING AND PROCEDURES  Imaging and Procedures for 01/18/2023  OCT, Retina - OU - Both Eyes       Right Eye Quality was borderline. Central Foveal  Thickness: 223. Progression has been stable. Findings include no IRF, abnormal foveal contour, subretinal hyper-reflective material, outer retinal atrophy (Mac hole closed; central SRHM and ORA, trace vitreous opacities -- improved).   Left Eye Quality was borderline. Central Foveal Thickness: 422. Progression has been stable. Findings include no IRF, no SRF, abnormal foveal contour, epiretinal membrane, lamellar hole, macular pucker (ERM w/ pucker and lamellar hole; no IRF/SRF centrally -- stable).   Notes *Images captured and stored on drive  Diagnosis / Impression:  OD: Mac hole closed; central SRHM and ORA, trace vitreous opacities -- improved OS: ERM w/ pucker and lamellar hole; no IRF/SRF centrally -- stable  Clinical management:  See below  Abbreviations: NFP - Normal foveal profile. CME - cystoid macular edema. PED - pigment epithelial detachment. IRF - intraretinal fluid. SRF - subretinal fluid. EZ - ellipsoid zone. ERM - epiretinal membrane. ORA - outer retinal atrophy. ORT - outer retinal tubulation. SRHM - subretinal hyper-reflective material. IRHM - intraretinal hyper-reflective material             ASSESSMENT/PLAN:    ICD-10-CM   1. Macular hole of right eye  H35.341 OCT, Retina - OU - Both Eyes    2. Retinal tear of left eye  H33.312     3. History of retinal detachment  Z86.69     4. Epiretinal membrane (ERM) of both eyes  H35.373     5. Diabetes mellitus type 2 without retinopathy  E11.9     6. Essential hypertension  I10      7. Hypertensive retinopathy of both eyes  H35.033     8. Combined forms of age-related cataract of left eye  H25.812     9. Pseudophakia  Z96.1     10. Dry eyes  H04.123       1. Macular hole, right eye  - pre op OCT showed full thickness macular hole with surrounding cystic changes - pre-op BCVA 20/80 - s/p PPV/TissueBlue/MP/14% C3F8 OD, 11.16.23 - doing well - retina attached and mac hole closed  - BCVA 20/100+1 -- stable  - OCT shows mac hole closed, persistent central SRHM and ORA - IOP 18 today -- okay to stop Cosopt from qd OD  - f/u 3-4 months DFE, OCT  2. Retinal tears, OS  - three small holes at 0100, three large retinal tears from 0130-0400 - s/p laser retinopexy OS (10.27.23) -- good laser changes surrounding all breaks - focal lattice degeneration at 1030 also treated - no new RT/RD - monitor  3. History of retinal detachment OD  - 1998 -- s/p repair in [place], then pt flew to [place] and underwent more definitive repair OD  - retina attached OD  - stable  4. ERM OU - mild ERM OD; OS w/ lamellar hole and pucker - BCVA OD 20/100 (from macular hole); OS 20/30 - asymptomatic, no metamorphopsia - monitor for now  5. Diabetes mellitus, type 2 without retinopathy  - diagnosed with DM2 in 2022 - The incidence, risk factors for progression, natural history and treatment options for diabetic retinopathy  were discussed with patient.   - The need for close monitoring of blood glucose, blood pressure, and serum lipids, avoiding cigarette or any type of tobacco, and the need for long term follow up was also discussed with patient. - f/u in 1 year, sooner prn  6,7. Hypertensive retinopathy OU - discussed importance of tight BP control - continue to monitor  8. Mixed Cataract OS - The symptoms  of cataract, surgical options, and treatments and risks were discussed with patient. - discussed diagnosis and progression - approaching visual significance and now clear  from a retina standpoint to proceed with cataract surgery when pt and surgeon are ready  - will refer to Dr. Lucianne Lei for consult  9. Pseudophakia OD  - s/p CE/IOL OD  - IOL in good position, doing well  - continue to monitor  10. Dry Eyes OU - recommend artificial tears and lubricating ointment as needed  Ophthalmic Meds Ordered this visit:  No orders of the defined types were placed in this encounter.    Return for f/u 3-4 months, mac hole OD, DFE, OCT.  There are no Patient Instructions on file for this visit.  This document serves as a record of services personally performed by Gardiner Sleeper, MD, PhD. It was created on their behalf by Orvan Falconer, an ophthalmic technician. The creation of this record is the provider's dictation and/or activities during the visit.    Electronically signed by: Orvan Falconer, OA, 01/18/23  4:34 PM  This document serves as a record of services personally performed by Gardiner Sleeper, MD, PhD. It was created on their behalf by San Jetty. Owens Shark, OA an ophthalmic technician. The creation of this record is the provider's dictation and/or activities during the visit.    Electronically signed by: San Jetty. Owens Shark, [place] 04.01.2024 4:34 PM  Gardiner Sleeper, M.D., Ph.D. Diseases & Surgery of the Retina and Vitreous [facility]  I have reviewed the above documentation for accuracy and completeness, and I agree with the above. Gardiner Sleeper, M.D., Ph.D. 01/18/23 4:34 PM   Abbreviations: M myopia (nearsighted); A astigmatism; H hyperopia (farsighted); P presbyopia; Mrx spectacle prescription;  CTL contact lenses; OD right eye; OS left eye; OU both eyes  XT exotropia; ET esotropia; PEK punctate epithelial keratitis; PEE punctate epithelial erosions; DES dry eye syndrome; MGD meibomian gland dysfunction; ATs artificial tears; PFAT's preservative free artificial tears; [place] nuclear sclerotic cataract; PSC posterior subcapsular cataract;  ERM epi-retinal membrane; PVD posterior vitreous detachment; RD retinal detachment; DM diabetes mellitus; DR diabetic retinopathy; NPDR non-proliferative diabetic retinopathy; PDR proliferative diabetic retinopathy; CSME clinically significant macular edema; DME diabetic macular edema; dbh dot blot hemorrhages; CWS cotton wool spot; POAG primary open angle glaucoma; C/D cup-to-disc ratio; HVF humphrey visual field; GVF goldmann visual field; OCT optical coherence tomography; IOP intraocular pressure; BRVO Branch retinal vein occlusion; CRVO central retinal vein occlusion; CRAO central retinal artery occlusion; BRAO branch retinal artery occlusion; RT retinal tear; SB scleral buckle; PPV pars plana vitrectomy; VH Vitreous hemorrhage; PRP panretinal laser photocoagulation; IVK intravitreal kenalog; VMT vitreomacular traction; MH Macular hole;  NVD neovascularization of the disc; NVE neovascularization elsewhere; AREDS age related eye disease study; ARMD age related macular degeneration; POAG primary open angle glaucoma; EBMD epithelial/anterior basement membrane dystrophy; ACIOL anterior chamber intraocular lens; IOL intraocular lens; PCIOL posterior chamber intraocular lens; Phaco/IOL phacoemulsification with intraocular lens placement; [place] photorefractive keratectomy; LASIK laser assisted in situ keratomileusis; HTN hypertension; DM diabetes mellitus; COPD chronic obstructive pulmonary disease

## 2023-01-18 ENCOUNTER — Encounter (INDEPENDENT_AMBULATORY_CARE_PROVIDER_SITE_OTHER): Payer: Self-pay | Admitting: Ophthalmology

## 2023-01-18 ENCOUNTER — Ambulatory Visit (INDEPENDENT_AMBULATORY_CARE_PROVIDER_SITE_OTHER): Payer: BC Managed Care – PPO | Admitting: Ophthalmology

## 2023-01-18 DIAGNOSIS — H35033 Hypertensive retinopathy, bilateral: Secondary | ICD-10-CM | POA: Diagnosis not present

## 2023-01-18 DIAGNOSIS — Z961 Presence of intraocular lens: Secondary | ICD-10-CM

## 2023-01-18 DIAGNOSIS — Z8669 Personal history of other diseases of the nervous system and sense organs: Secondary | ICD-10-CM | POA: Diagnosis not present

## 2023-01-18 DIAGNOSIS — H35373 Puckering of macula, bilateral: Secondary | ICD-10-CM

## 2023-01-18 DIAGNOSIS — H35341 Macular cyst, hole, or pseudohole, right eye: Secondary | ICD-10-CM | POA: Diagnosis not present

## 2023-01-18 DIAGNOSIS — H04123 Dry eye syndrome of bilateral lacrimal glands: Secondary | ICD-10-CM

## 2023-01-18 DIAGNOSIS — H33312 Horseshoe tear of retina without detachment, left eye: Secondary | ICD-10-CM

## 2023-01-18 DIAGNOSIS — H25812 Combined forms of age-related cataract, left eye: Secondary | ICD-10-CM

## 2023-01-18 DIAGNOSIS — I10 Essential (primary) hypertension: Secondary | ICD-10-CM | POA: Diagnosis not present

## 2023-01-18 DIAGNOSIS — E119 Type 2 diabetes mellitus without complications: Secondary | ICD-10-CM

## 2023-03-01 DIAGNOSIS — I10 Essential (primary) hypertension: Secondary | ICD-10-CM | POA: Diagnosis not present

## 2023-03-01 DIAGNOSIS — E1165 Type 2 diabetes mellitus with hyperglycemia: Secondary | ICD-10-CM | POA: Diagnosis not present

## 2023-03-01 DIAGNOSIS — S81812D Laceration without foreign body, left lower leg, subsequent encounter: Secondary | ICD-10-CM | POA: Diagnosis not present

## 2023-03-08 DIAGNOSIS — E1165 Type 2 diabetes mellitus with hyperglycemia: Secondary | ICD-10-CM | POA: Diagnosis not present

## 2023-03-08 DIAGNOSIS — K219 Gastro-esophageal reflux disease without esophagitis: Secondary | ICD-10-CM | POA: Diagnosis not present

## 2023-03-08 DIAGNOSIS — I10 Essential (primary) hypertension: Secondary | ICD-10-CM | POA: Diagnosis not present

## 2023-03-08 DIAGNOSIS — S81802D Unspecified open wound, left lower leg, subsequent encounter: Secondary | ICD-10-CM | POA: Diagnosis not present

## 2023-04-26 ENCOUNTER — Other Ambulatory Visit: Payer: Self-pay | Admitting: Internal Medicine

## 2023-04-26 DIAGNOSIS — E1165 Type 2 diabetes mellitus with hyperglycemia: Secondary | ICD-10-CM | POA: Diagnosis not present

## 2023-04-26 DIAGNOSIS — Z1211 Encounter for screening for malignant neoplasm of colon: Secondary | ICD-10-CM | POA: Diagnosis not present

## 2023-04-26 DIAGNOSIS — E0842 Diabetes mellitus due to underlying condition with diabetic polyneuropathy: Secondary | ICD-10-CM | POA: Diagnosis not present

## 2023-04-26 DIAGNOSIS — Z125 Encounter for screening for malignant neoplasm of prostate: Secondary | ICD-10-CM | POA: Diagnosis not present

## 2023-04-26 DIAGNOSIS — I10 Essential (primary) hypertension: Secondary | ICD-10-CM | POA: Diagnosis not present

## 2023-04-26 DIAGNOSIS — Z0000 Encounter for general adult medical examination without abnormal findings: Secondary | ICD-10-CM | POA: Diagnosis not present

## 2023-04-26 DIAGNOSIS — E559 Vitamin D deficiency, unspecified: Secondary | ICD-10-CM | POA: Diagnosis not present

## 2023-04-27 LAB — COMPLETE METABOLIC PANEL WITH GFR
AG Ratio: 1.6 (calc) (ref 1.0–2.5)
ALT: 27 U/L (ref 9–46)
AST: 17 U/L (ref 10–35)
Albumin: 4.7 g/dL (ref 3.6–5.1)
Alkaline phosphatase (APISO): 65 U/L (ref 35–144)
BUN: 16 mg/dL (ref 7–25)
CO2: 25 mmol/L (ref 20–32)
Calcium: 9.9 mg/dL (ref 8.6–10.3)
Chloride: 102 mmol/L (ref 98–110)
Creat: 0.87 mg/dL (ref 0.70–1.35)
Globulin: 3 g/dL (calc) (ref 1.9–3.7)
Glucose, Bld: 132 mg/dL — ABNORMAL HIGH (ref 65–99)
Potassium: 4.4 mmol/L (ref 3.5–5.3)
Sodium: 138 mmol/L (ref 135–146)
Total Bilirubin: 0.5 mg/dL (ref 0.2–1.2)
Total Protein: 7.7 g/dL (ref 6.1–8.1)
eGFR: 99 mL/min/{1.73_m2} (ref >=60)

## 2023-04-27 LAB — LIPID PANEL
Cholesterol: 210 mg/dL — ABNORMAL HIGH (ref <200)
HDL: 51 mg/dL (ref >=40)
LDL Cholesterol (Calc): 124 mg/dL (calc) — ABNORMAL HIGH
Non-HDL Cholesterol (Calc): 159 mg/dL (calc) — ABNORMAL HIGH (ref <130)
Total CHOL/HDL Ratio: 4.1 (calc) (ref <5.0)
Triglycerides: 232 mg/dL — ABNORMAL HIGH (ref <150)

## 2023-04-27 LAB — CBC
HCT: 47.5 % (ref 38.5–50.0)
Hemoglobin: 15.9 g/dL (ref 13.2–17.1)
MCH: 27.1 pg (ref 27.0–33.0)
MCHC: 33.5 g/dL (ref 32.0–36.0)
MCV: 80.9 fL (ref 80.0–100.0)
MPV: 10.4 fL (ref 7.5–12.5)
Platelets: 229 10*3/uL (ref 140–400)
RBC: 5.87 10*6/uL — ABNORMAL HIGH (ref 4.20–5.80)
RDW: 12.8 % (ref 11.0–15.0)
WBC: 6.4 10*3/uL (ref 3.8–10.8)

## 2023-04-27 LAB — TSH: TSH: 1.57 mIU/L (ref 0.40–4.50)

## 2023-04-27 LAB — VITAMIN B12: Vitamin B-12: 523 pg/mL (ref 200–1100)

## 2023-04-27 LAB — FOLATE: Folate: 15.4 ng/mL

## 2023-04-27 LAB — PSA: PSA: 0.64 ng/mL (ref <=4.00)

## 2023-04-27 LAB — VITAMIN D 25 HYDROXY (VIT D DEFICIENCY, FRACTURES): Vit D, 25-Hydroxy: 39 ng/mL (ref 30–100)

## 2023-05-24 ENCOUNTER — Encounter (INDEPENDENT_AMBULATORY_CARE_PROVIDER_SITE_OTHER): Payer: Self-pay

## 2023-05-24 ENCOUNTER — Encounter (INDEPENDENT_AMBULATORY_CARE_PROVIDER_SITE_OTHER): Payer: BC Managed Care – PPO | Admitting: Ophthalmology

## 2023-05-24 DIAGNOSIS — Z8669 Personal history of other diseases of the nervous system and sense organs: Secondary | ICD-10-CM

## 2023-05-24 DIAGNOSIS — H35033 Hypertensive retinopathy, bilateral: Secondary | ICD-10-CM

## 2023-05-24 DIAGNOSIS — H25812 Combined forms of age-related cataract, left eye: Secondary | ICD-10-CM

## 2023-05-24 DIAGNOSIS — I10 Essential (primary) hypertension: Secondary | ICD-10-CM

## 2023-05-24 DIAGNOSIS — Z961 Presence of intraocular lens: Secondary | ICD-10-CM

## 2023-05-24 DIAGNOSIS — H33312 Horseshoe tear of retina without detachment, left eye: Secondary | ICD-10-CM

## 2023-05-24 DIAGNOSIS — H04123 Dry eye syndrome of bilateral lacrimal glands: Secondary | ICD-10-CM

## 2023-05-24 DIAGNOSIS — H35373 Puckering of macula, bilateral: Secondary | ICD-10-CM

## 2023-05-24 DIAGNOSIS — E119 Type 2 diabetes mellitus without complications: Secondary | ICD-10-CM

## 2023-05-24 DIAGNOSIS — H35341 Macular cyst, hole, or pseudohole, right eye: Secondary | ICD-10-CM

## 2023-08-02 DIAGNOSIS — E1165 Type 2 diabetes mellitus with hyperglycemia: Secondary | ICD-10-CM | POA: Diagnosis not present

## 2023-08-02 DIAGNOSIS — I10 Essential (primary) hypertension: Secondary | ICD-10-CM | POA: Diagnosis not present

## 2023-08-02 DIAGNOSIS — K219 Gastro-esophageal reflux disease without esophagitis: Secondary | ICD-10-CM | POA: Diagnosis not present

## 2023-08-02 DIAGNOSIS — Z418 Encounter for other procedures for purposes other than remedying health state: Secondary | ICD-10-CM | POA: Diagnosis not present

## 2024-03-09 ENCOUNTER — Encounter (INDEPENDENT_AMBULATORY_CARE_PROVIDER_SITE_OTHER): Payer: Self-pay | Admitting: Ophthalmology

## 2024-03-10 ENCOUNTER — Ambulatory Visit: Payer: Self-pay | Admitting: Internal Medicine

## 2024-03-10 ENCOUNTER — Encounter: Payer: Self-pay | Admitting: Internal Medicine

## 2024-03-10 VITALS — BP 142/100 | HR 88 | Temp 97.8°F | Ht 66.0 in | Wt 196.0 lb

## 2024-03-10 DIAGNOSIS — R351 Nocturia: Secondary | ICD-10-CM

## 2024-03-10 DIAGNOSIS — E119 Type 2 diabetes mellitus without complications: Secondary | ICD-10-CM

## 2024-03-10 DIAGNOSIS — E1121 Type 2 diabetes mellitus with diabetic nephropathy: Secondary | ICD-10-CM | POA: Insufficient documentation

## 2024-03-10 DIAGNOSIS — E782 Mixed hyperlipidemia: Secondary | ICD-10-CM | POA: Diagnosis not present

## 2024-03-10 DIAGNOSIS — E139 Other specified diabetes mellitus without complications: Secondary | ICD-10-CM

## 2024-03-10 DIAGNOSIS — Z1331 Encounter for screening for depression: Secondary | ICD-10-CM | POA: Diagnosis not present

## 2024-03-10 DIAGNOSIS — N401 Enlarged prostate with lower urinary tract symptoms: Secondary | ICD-10-CM | POA: Insufficient documentation

## 2024-03-10 DIAGNOSIS — R809 Proteinuria, unspecified: Secondary | ICD-10-CM

## 2024-03-10 DIAGNOSIS — I10 Essential (primary) hypertension: Secondary | ICD-10-CM

## 2024-03-10 LAB — POCT URINALYSIS DIPSTICK
Bilirubin, UA: NEGATIVE
Blood, UA: NEGATIVE
Glucose, UA: NEGATIVE
Ketones, UA: NEGATIVE
Leukocytes, UA: NEGATIVE
Nitrite, UA: NEGATIVE
Protein, UA: NEGATIVE
Spec Grav, UA: 1.025 (ref 1.010–1.025)
Urobilinogen, UA: 0.2 U/dL
pH, UA: 6 (ref 5.0–8.0)

## 2024-03-10 LAB — GLUCOSE, POCT (MANUAL RESULT ENTRY): POC Glucose: 133 mg/dL — AB (ref 70–99)

## 2024-03-10 LAB — POC CREATINE & ALBUMIN,URINE
Albumin/Creatinine Ratio, Urine, POC: 150
Creatinine, POC: 100 mg/dL

## 2024-03-10 MED ORDER — EMPAGLIFLOZIN 10 MG PO TABS: 10.0000 mg | ORAL_TABLET | Freq: Every day | ORAL | 2 refills | Status: DC

## 2024-03-10 MED ORDER — AMLODIPINE-OLMESARTAN 5-20 MG PO TABS: 1.0000 | ORAL_TABLET | Freq: Every day | ORAL | 0 refills | Status: DC

## 2024-03-10 MED ORDER — TAMSULOSIN HCL 0.4 MG PO CAPS: 0.4000 mg | ORAL_CAPSULE | Freq: Every day | ORAL | 0 refills | Status: DC

## 2024-03-10 NOTE — Progress Notes (Signed)
 New Patient Office Visit  Subjective:  Patient ID: Kenneth Perez, male    DOB: January 24, 1961  Age: 63 y.o. MRN: [id number]  Chief Complaint  Patient presents with   Establish Care    NPE    Here to establish IM care. No new complaints with pmh of diabetes mellitus, htn and gerd. Today he c/o slow of micturition, nocturia and dysuria. Admits to noncompliance with antihypertensives.    No other concerns at this time.   Past Medical History:  Diagnosis Date   Diabetes mellitus without complication (HCC)    GERD (gastroesophageal reflux disease)    Hypertension     Past Surgical History:  Procedure Laterality Date   GAS INSERTION Right 09/03/2022   Procedure: INSERTION OF GAS;  Surgeon: Ronelle Coffee, MD;  Location[facility];  Service: Ophthalmology;  Laterality: Right;   GAS/FLUID EXCHANGE Right 09/03/2022   Procedure: GAS/FLUID EXCHANGE;  Surgeon: Ronelle Coffee, MD;  Location: [facility] OR;  Service: Ophthalmology;  Laterality: Right;   LASER PHOTO ABLATION Right 09/03/2022   Procedure: LASER PHOTO ABLATION;  Surgeon: Ronelle Coffee, MD;  Location: [facility] OR;  Service: Ophthalmology;  Laterality: Right;   MEMBRANE PEEL Right 09/03/2022   Procedure: MEMBRANE PEEL;  Surgeon: Ronelle Coffee, MD;  Location: [facility] OR;  Service: Ophthalmology;  Laterality: Right;   PARS PLANA VITRECTOMY Right 09/03/2022   Procedure: PARS PLANA VITRECTOMY WITH 25 GAUGE;  Surgeon: Ronelle Coffee, MD;  Location: [facility] OR;  Service: Ophthalmology;  Laterality: Right;    Social History   Socioeconomic History   Marital status: Married    Spouse name: Not on file   Number of children: Not on file   Years of education: Not on file   Highest education level: Not on file  Occupational History   Occupation: Horticulturist, commercial: [employer]  Tobacco Use   Smoking status: Never   Smokeless tobacco: Never  Vaping Use   Vaping status: Never Used  Substance and Sexual Activity   Alcohol use: Never   Drug use:  Never   Sexual activity: Not Currently  Other Topics Concern   Not on file  Social History Narrative   ** Merged History Encounter **       Social Drivers of Corporate investment banker Strain: Not on file  Food Insecurity: Not on file  Transportation Needs: Not on file  Physical Activity: Not on file  Stress: Not on file  Social Connections: Not on file  Intimate Partner Violence: Not on file    History reviewed. No pertinent family history.  No Known Allergies  Outpatient Medications Prior to Visit  Medication Sig   glimepiride (AMARYL) 4 MG tablet Take 4 mg by mouth daily.   ibuprofen (ADVIL,MOTRIN) 200 MG tablet Take 200 mg by mouth every 6 (six) hours as needed for mild pain.   metFORMIN (GLUCOPHAGE) 500 MG tablet Take 500 mg by mouth 2 (two) times daily.   omeprazole (PRILOSEC) 20 MG capsule Take 20 mg by mouth 2 (two) times daily.   prednisoLONE  acetate (PRED FORTE ) 1 % ophthalmic suspension Place 1 drop into the right eye 4 (four) times daily.   traMADol (ULTRAM) 50 MG tablet Take 1 tablet (50 mg total) by mouth every 12 (twelve) hours as needed.   Vitamin D , Ergocalciferol , (DRISDOL) 1.25 MG (50000 UNIT) CAPS capsule Take 50,000 Units by mouth every Thursday.   [DISCONTINUED] losartan-hydrochlorothiazide (HYZAAR) 100-12.5 MG tablet Take 1 tablet by mouth daily. (Patient not  taking: Reported on 03/10/2024)   [DISCONTINUED] prednisoLONE  acetate (PRED FORTE ) 1 % ophthalmic suspension Place 1 drop into the left eye in the morning, at noon, and at bedtime. (Patient not taking: Reported on 03/10/2024)   No facility-administered medications prior to visit.    Review of Systems  Constitutional:  Positive for weight loss.  HENT: Negative.    Gastrointestinal:  Positive for constipation (with ppi) and heartburn.  Genitourinary:  Positive for frequency.  Skin: Negative.        Objective:   BP (!) 142/100   Pulse 88   Temp 97.8 F (36.6 C)   Ht 5\' 6"  (1.676 m)   Wt  196 lb (88.9 kg)   SpO2 98%   BMI 31.64 kg/m   Vitals:   03/10/24 1417  BP: (!) 142/100  Pulse: 88  Temp: 97.8 F (36.6 C)  Height: 5\' 6"  (1.676 m)  Weight: 196 lb (88.9 kg)  SpO2: 98%  BMI (Calculated): 31.65    Physical Exam Vitals reviewed.  Constitutional:      Appearance: Normal appearance. He is obese.  HENT:     Head: Normocephalic.     Left Ear: There is no impacted cerumen.     Nose: Nose normal.     Mouth/Throat:     Mouth: Mucous membranes are moist.     Pharynx: No posterior oropharyngeal erythema.  Eyes:     Extraocular Movements: Extraocular movements intact.     Pupils: Pupils are equal, round, and reactive to light.  Cardiovascular:     Rate and Rhythm: Regular rhythm.     Chest Wall: PMI is not displaced.     Pulses: Normal pulses.     Heart sounds: Normal heart sounds. No murmur heard. Pulmonary:     Effort: Pulmonary effort is normal.     Breath sounds: Normal air entry. No rhonchi or rales.  Abdominal:     General: Abdomen is flat. Bowel sounds are normal. There is no distension.     Palpations: Abdomen is soft. There is no hepatomegaly, splenomegaly or mass.     Tenderness: There is no abdominal tenderness.  Musculoskeletal:        General: Normal range of motion.     Cervical back: Normal range of motion and neck supple.     Right lower leg: No edema.     Left lower leg: No edema.  Skin:    General: Skin is warm and dry.  Neurological:     General: No focal deficit present.     Mental Status: He is alert and oriented to person, place, and time.     Cranial Nerves: No cranial nerve deficit.     Motor: No weakness.  Psychiatric:        Mood and Affect: Mood normal.        Behavior: Behavior normal.      Results for orders placed or performed in visit on 03/10/24  POCT Glucose (CBG)  Result Value Ref Range   POC Glucose 133 (A) 70 - 99 mg/dl  POC CREATINE & ALBUMIN,URINE  Result Value Ref Range   Microalbumin Ur, POC 30-300 mg/L    Creatinine, POC 100 mg/dL   Albumin/Creatinine Ratio, Urine, POC 150   POCT Urinalysis Dipstick (96045)  Result Value Ref Range   Color, UA yellow    Clarity, UA clear    Glucose, UA Negative Negative   Bilirubin, UA negative    Ketones, UA negative    Spec  Grav, UA 1.025 1.010 - 1.025   Blood, UA negative    pH, UA 6.0 5.0 - 8.0   Protein, UA Negative Negative   Urobilinogen, UA 0.2 0.2 or 1.0 E.U./dL   Nitrite, UA negative    Leukocytes, UA Negative Negative   Appearance clear    Odor none     Recent Results (from the past 2160 hours)  POCT Glucose (CBG)     Status: Abnormal   Collection Time: 03/10/24  2:32 PM  Result Value Ref Range   POC Glucose 133 (A) 70 - 99 mg/dl  POCT Urinalysis Dipstick (16109)     Status: Normal   Collection Time: 03/10/24  2:40 PM  Result Value Ref Range   Color, UA yellow    Clarity, UA clear    Glucose, UA Negative Negative   Bilirubin, UA negative    Ketones, UA negative    Spec Grav, UA 1.025 1.010 - 1.025   Blood, UA negative    pH, UA 6.0 5.0 - 8.0   Protein, UA Negative Negative   Urobilinogen, UA 0.2 0.2 or 1.0 E.U./dL   Nitrite, UA negative    Leukocytes, UA Negative Negative   Appearance clear    Odor none   POC CREATINE & ALBUMIN,URINE     Status: Abnormal   Collection Time: 03/10/24  2:42 PM  Result Value Ref Range   Microalbumin Ur, POC 30-300 mg/L   Creatinine, POC 100 mg/dL   Albumin/Creatinine Ratio, Urine, POC 150       Assessment & Plan:  As per problem list  Problem List Items Addressed This Visit       Endocrine   Controlled type 2 diabetes mellitus without complication, without long-term current use of insulin  (HCC) - Primary   Relevant Medications   empagliflozin (JARDIANCE) 10 MG TABS tablet   amLODipine-olmesartan (AZOR) 5-20 MG tablet   Other Relevant Orders   POCT Glucose (CBG) (Completed)   POC CREATINE & ALBUMIN,URINE (Completed)   POCT Urinalysis Dipstick (81002) (Completed)   Lipid panel    Hemoglobin A1c     Other   Microalbuminuria   Benign prostatic hyperplasia with nocturia   Relevant Medications   tamsulosin (FLOMAX) 0.4 MG CAPS capsule   Other Relevant Orders   PSA   Other Visit Diagnoses       Diabetes 1.5, managed as type 2 (HCC)       Relevant Medications   empagliflozin (JARDIANCE) 10 MG TABS tablet   amLODipine-olmesartan (AZOR) 5-20 MG tablet     Primary hypertension       Relevant Medications   amLODipine-olmesartan (AZOR) 5-20 MG tablet   Other Relevant Orders   CBC With Diff/Platelet   Comprehensive metabolic panel with GFR     Mixed hyperlipidemia       Relevant Medications   amLODipine-olmesartan (AZOR) 5-20 MG tablet       Return in about 3 weeks (around 03/31/2024) for cpe with labs prior.   Total time spent: 30 minutes  Arzella Bitters, MD  03/10/2024   This document may have been prepared by [facility] Voice Recognition software and as such may include unintentional dictation errors.

## 2024-04-06 ENCOUNTER — Other Ambulatory Visit: Payer: Self-pay | Admitting: Internal Medicine

## 2024-04-06 DIAGNOSIS — I10 Essential (primary) hypertension: Secondary | ICD-10-CM

## 2024-04-07 ENCOUNTER — Other Ambulatory Visit: Payer: Self-pay | Admitting: Internal Medicine

## 2024-04-07 DIAGNOSIS — I10 Essential (primary) hypertension: Secondary | ICD-10-CM

## 2024-04-10 ENCOUNTER — Other Ambulatory Visit: Payer: Self-pay | Admitting: Internal Medicine

## 2024-04-10 ENCOUNTER — Ambulatory Visit (INDEPENDENT_AMBULATORY_CARE_PROVIDER_SITE_OTHER): Admitting: Internal Medicine

## 2024-04-10 ENCOUNTER — Encounter: Payer: Self-pay | Admitting: Internal Medicine

## 2024-04-10 VITALS — BP 102/80 | HR 81 | Temp 98.2°F | Ht 66.0 in | Wt 192.2 lb

## 2024-04-10 DIAGNOSIS — K219 Gastro-esophageal reflux disease without esophagitis: Secondary | ICD-10-CM | POA: Diagnosis not present

## 2024-04-10 DIAGNOSIS — E119 Type 2 diabetes mellitus without complications: Secondary | ICD-10-CM | POA: Diagnosis not present

## 2024-04-10 DIAGNOSIS — Z1331 Encounter for screening for depression: Secondary | ICD-10-CM

## 2024-04-10 DIAGNOSIS — Z0001 Encounter for general adult medical examination with abnormal findings: Secondary | ICD-10-CM

## 2024-04-10 DIAGNOSIS — Z0130 Encounter for examination of blood pressure without abnormal findings: Secondary | ICD-10-CM

## 2024-04-10 LAB — GLUCOSE, POCT (MANUAL RESULT ENTRY): POC Glucose: 232 mg/dL — AB (ref 70–99)

## 2024-04-10 MED ORDER — PANTOPRAZOLE SODIUM 20 MG PO TBEC: 20.0000 mg | DELAYED_RELEASE_TABLET | Freq: Every day | ORAL | 1 refills | Status: DC

## 2024-04-10 NOTE — Progress Notes (Signed)
 Established Patient Office Visit  Subjective:  Patient ID: Kenneth Perez, male    DOB: 03/02/1961  Age: 63 y.o. MRN: [id number]  Chief Complaint  Patient presents with   Annual Exam    Physical with lab results    No new complaints, here for CPE, lab review and medication refills. Reports recurrence of GERD. Failed to have previsit labs done.      No other concerns at this time.   Past Medical History:  Diagnosis Date   Diabetes mellitus without complication (HCC)    GERD (gastroesophageal reflux disease)    Hypertension     Past Surgical History:  Procedure Laterality Date   GAS INSERTION Right 09/03/2022   Procedure: INSERTION OF GAS;  Surgeon: Valdemar Rogue, MD;  Location: [facility] OR;  Service: Ophthalmology;  Laterality: Right;   GAS/FLUID EXCHANGE Right 09/03/2022   Procedure: GAS/FLUID EXCHANGE;  Surgeon: Valdemar Rogue, MD;  Location: [facility] OR;  Service: Ophthalmology;  Laterality: Right;   LASER PHOTO ABLATION Right 09/03/2022   Procedure: LASER PHOTO ABLATION;  Surgeon: Valdemar Rogue, MD;  Location: [facility] OR;  Service: Ophthalmology;  Laterality: Right;   MEMBRANE PEEL Right 09/03/2022   Procedure: MEMBRANE PEEL;  Surgeon: Valdemar Rogue, MD;  Location: [facility] OR;  Service: Ophthalmology;  Laterality: Right;   PARS PLANA VITRECTOMY Right 09/03/2022   Procedure: PARS PLANA VITRECTOMY WITH 25 GAUGE;  Surgeon: Valdemar Rogue, MD;  Location: [facility] OR;  Service: Ophthalmology;  Laterality: Right;    Social History   Socioeconomic History   Marital status: Married    Spouse name: Not on file   Number of children: Not on file   Years of education: Not on file   Highest education level: Not on file  Occupational History   Occupation: Horticulturist, commercial: [employer]  Tobacco Use   Smoking status: Never   Smokeless tobacco: Never  Vaping Use   Vaping status: Never Used  Substance and Sexual Activity   Alcohol use: Never   Drug use: Never   Sexual activity: Not  Currently  Other Topics Concern   Not on file  Social History Narrative   ** Merged History Encounter **       Social Drivers of Corporate investment banker Strain: Not on file  Food Insecurity: Not on file  Transportation Needs: Not on file  Physical Activity: Not on file  Stress: Not on file  Social Connections: Not on file  Intimate Partner Violence: Not on file    No family history on file.  No Known Allergies  Outpatient Medications Prior to Visit  Medication Sig   amLODipine -olmesartan  (AZOR ) 5-20 MG tablet TAKE 1 TABLET BY MOUTH DAILY   empagliflozin  (JARDIANCE ) 10 MG TABS tablet Take 1 tablet (10 mg total) by mouth daily before breakfast.   glimepiride (AMARYL) 4 MG tablet Take 4 mg by mouth daily.   metFORMIN (GLUCOPHAGE) 500 MG tablet Take 500 mg by mouth 2 (two) times daily.   omeprazole (PRILOSEC) 20 MG capsule Take 20 mg by mouth 2 (two) times daily.   prednisoLONE  acetate (PRED FORTE ) 1 % ophthalmic suspension Place 1 drop into the right eye 4 (four) times daily.   tamsulosin  (FLOMAX ) 0.4 MG CAPS capsule Take 1 capsule (0.4 mg total) by mouth daily after breakfast.   Vitamin D , Ergocalciferol , (DRISDOL) 1.25 MG (50000 UNIT) CAPS capsule Take 50,000 Units by mouth every Thursday.   ibuprofen (ADVIL,MOTRIN) 200 MG tablet Take 200 mg by mouth  every 6 (six) hours as needed for mild pain.   traMADol (ULTRAM) 50 MG tablet Take 1 tablet (50 mg total) by mouth every 12 (twelve) hours as needed.   No facility-administered medications prior to visit.    Review of Systems  Constitutional:  Positive for weight loss.  HENT: Negative.    Eyes: Negative.   Respiratory: Negative.    Cardiovascular: Negative.   Gastrointestinal:  Positive for constipation (with ppi) and heartburn.  Genitourinary:  Positive for frequency.  Skin: Negative.        Objective:   BP 102/80   Pulse 81   Temp 98.2 F (36.8 C)   Ht 5' 6 (1.676 m)   Wt 192 lb 3.2 oz (87.2 kg)   SpO2 99%    BMI 31.02 kg/m   Vitals:   04/10/24 1401  BP: 102/80  Pulse: 81  Temp: 98.2 F (36.8 C)  Height: 5' 6 (1.676 m)  Weight: 192 lb 3.2 oz (87.2 kg)  SpO2: 99%  BMI (Calculated): 31.04    Physical Exam Vitals reviewed.  Constitutional:      Appearance: Normal appearance. He is obese.  HENT:     Head: Normocephalic.     Left Ear: There is no impacted cerumen.     Nose: Nose normal.     Mouth/Throat:     Mouth: Mucous membranes are moist.     Pharynx: No posterior oropharyngeal erythema.   Eyes:     Extraocular Movements: Extraocular movements intact.     Pupils: Pupils are equal, round, and reactive to light.    Cardiovascular:     Rate and Rhythm: Regular rhythm.     Chest Wall: PMI is not displaced.     Pulses: Normal pulses.     Heart sounds: Normal heart sounds. No murmur heard. Pulmonary:     Effort: Pulmonary effort is normal.     Breath sounds: Normal air entry. No rhonchi or rales.  Abdominal:     General: Abdomen is flat. Bowel sounds are normal. There is no distension.     Palpations: Abdomen is soft. There is no hepatomegaly, splenomegaly or mass.     Tenderness: There is no abdominal tenderness.   Musculoskeletal:        General: Normal range of motion.     Cervical back: Normal range of motion and neck supple.     Right lower leg: No edema.     Left lower leg: No edema.   Skin:    General: Skin is warm and dry.   Neurological:     General: No focal deficit present.     Mental Status: He is alert and oriented to person, place, and time.     Cranial Nerves: No cranial nerve deficit.     Motor: No weakness.   Psychiatric:        Mood and Affect: Mood normal.        Behavior: Behavior normal.      Results for orders placed or performed in visit on 04/10/24  POCT Glucose (CBG)  Result Value Ref Range   POC Glucose 232 (A) 70 - 99 mg/dl    Recent Results (from the past 2160 hours)  POCT Glucose (CBG)     Status: Abnormal   Collection  Time: 03/10/24  2:32 PM  Result Value Ref Range   POC Glucose 133 (A) 70 - 99 mg/dl  POCT Urinalysis Dipstick (18997)     Status: Normal   Collection Time:  03/10/24  2:40 PM  Result Value Ref Range   Color, UA yellow    Clarity, UA clear    Glucose, UA Negative Negative   Bilirubin, UA negative    Ketones, UA negative    Spec Grav, UA 1.025 1.010 - 1.025   Blood, UA negative    pH, UA 6.0 5.0 - 8.0   Protein, UA Negative Negative   Urobilinogen, UA 0.2 0.2 or 1.0 E.U./dL   Nitrite, UA negative    Leukocytes, UA Negative Negative   Appearance clear    Odor none   POC CREATINE & ALBUMIN,URINE     Status: Abnormal   Collection Time: 03/10/24  2:42 PM  Result Value Ref Range   Microalbumin Ur, POC 30-300 mg/L   Creatinine, POC 100 mg/dL   Albumin/Creatinine Ratio, Urine, POC 150   POCT Glucose (CBG)     Status: Abnormal   Collection Time: 04/10/24  2:11 PM  Result Value Ref Range   POC Glucose 232 (A) 70 - 99 mg/dl      Assessment & Plan:  As per problem list  Problem List Items Addressed This Visit       Digestive   Gastroesophageal reflux disease without esophagitis     Endocrine   Controlled type 2 diabetes mellitus without complication, without long-term current use of insulin  (HCC) - Primary   Relevant Orders   POCT Glucose (CBG) (Completed)   Other Visit Diagnoses       Encounter for general adult medical examination with abnormal findings           Return in 2 weeks (on 04/24/2024) for fu with labs prior.   Total time spent: 30 minutes  Sherrill Cinderella Perry, MD  04/10/2024   This document may have been prepared by [facility] Voice Recognition software and as such may include unintentional dictation errors.

## 2024-04-20 ENCOUNTER — Other Ambulatory Visit

## 2024-04-21 LAB — COMPREHENSIVE METABOLIC PANEL WITH GFR
ALT: 23 IU/L (ref 0–44)
AST: 22 IU/L (ref 0–40)
Albumin: 5.1 g/dL — ABNORMAL HIGH (ref 3.9–4.9)
Alkaline Phosphatase: 90 IU/L (ref 44–121)
BUN/Creatinine Ratio: 21 (ref 10–24)
BUN: 22 mg/dL (ref 8–27)
Bilirubin Total: 0.5 mg/dL (ref 0.0–1.2)
CO2: 19 mmol/L — ABNORMAL LOW (ref 20–29)
Calcium: 10 mg/dL (ref 8.6–10.2)
Chloride: 104 mmol/L (ref 96–106)
Creatinine, Ser: 1.06 mg/dL (ref 0.76–1.27)
Globulin, Total: 2.7 g/dL (ref 1.5–4.5)
Glucose: 174 mg/dL — ABNORMAL HIGH (ref 70–99)
Potassium: 4.3 mmol/L (ref 3.5–5.2)
Sodium: 144 mmol/L (ref 134–144)
Total Protein: 7.8 g/dL (ref 6.0–8.5)
eGFR: 80 mL/min/1.73 (ref >59)

## 2024-04-21 LAB — CBC WITH DIFF/PLATELET
Basophils Absolute: 0 x10E3/uL (ref 0.0–0.2)
Basos: 1 %
EOS (ABSOLUTE): 0.1 x10E3/uL (ref 0.0–0.4)
Eos: 1 %
Hematocrit: 49.6 % (ref 37.5–51.0)
Hemoglobin: 16.2 g/dL (ref 13.0–17.7)
Immature Grans (Abs): 0 x10E3/uL (ref 0.0–0.1)
Immature Granulocytes: 0 %
Lymphocytes Absolute: 2.1 x10E3/uL (ref 0.7–3.1)
Lymphs: 34 %
MCH: 27.9 pg (ref 26.6–33.0)
MCHC: 32.7 g/dL (ref 31.5–35.7)
MCV: 85 fL (ref 79–97)
Monocytes Absolute: 0.5 x10E3/uL (ref 0.1–0.9)
Monocytes: 7 %
Neutrophils Absolute: 3.6 x10E3/uL (ref 1.4–7.0)
Neutrophils: 57 %
Platelets: 227 x10E3/uL (ref 150–450)
RBC: 5.81 x10E6/uL — ABNORMAL HIGH (ref 4.14–5.80)
RDW: 13.2 % (ref 11.6–15.4)
WBC: 6.3 x10E3/uL (ref 3.4–10.8)

## 2024-04-21 LAB — PSA: Prostate Specific Ag, Serum: 0.7 ng/mL (ref 0.0–4.0)

## 2024-04-21 LAB — LIPID PANEL
Chol/HDL Ratio: 3.6 ratio (ref 0.0–5.0)
Cholesterol, Total: 202 mg/dL — ABNORMAL HIGH (ref 100–199)
HDL: 56 mg/dL (ref >39)
LDL Chol Calc (NIH): 132 mg/dL — ABNORMAL HIGH (ref 0–99)
Triglycerides: 80 mg/dL (ref 0–149)
VLDL Cholesterol Cal: 14 mg/dL (ref 5–40)

## 2024-04-21 LAB — HEMOGLOBIN A1C
Est. average glucose Bld gHb Est-mCnc: 209 mg/dL
Hgb A1c MFr Bld: 8.9 % — ABNORMAL HIGH (ref 4.8–5.6)

## 2024-04-24 ENCOUNTER — Ambulatory Visit: Admitting: Internal Medicine

## 2024-05-01 ENCOUNTER — Encounter: Payer: Self-pay | Admitting: Internal Medicine

## 2024-05-01 ENCOUNTER — Ambulatory Visit: Admitting: Internal Medicine

## 2024-05-01 VITALS — BP 118/82 | HR 94 | Temp 97.9°F | Ht 66.0 in | Wt 190.4 lb

## 2024-05-01 DIAGNOSIS — I10 Essential (primary) hypertension: Secondary | ICD-10-CM | POA: Diagnosis not present

## 2024-05-01 DIAGNOSIS — E782 Mixed hyperlipidemia: Secondary | ICD-10-CM | POA: Diagnosis not present

## 2024-05-01 DIAGNOSIS — E1165 Type 2 diabetes mellitus with hyperglycemia: Secondary | ICD-10-CM | POA: Insufficient documentation

## 2024-05-01 DIAGNOSIS — E1142 Type 2 diabetes mellitus with diabetic polyneuropathy: Secondary | ICD-10-CM | POA: Diagnosis not present

## 2024-05-01 DIAGNOSIS — E119 Type 2 diabetes mellitus without complications: Secondary | ICD-10-CM

## 2024-05-01 LAB — POCT CBG (FASTING - GLUCOSE)-MANUAL ENTRY: Glucose Fasting, POC: 210 mg/dL — AB (ref 70–99)

## 2024-05-01 MED ORDER — GABAPENTIN 100 MG PO CAPS: 100.0000 mg | ORAL_CAPSULE | Freq: Three times a day (TID) | ORAL | 2 refills | Status: DC

## 2024-05-01 MED ORDER — EMPAGLIFLOZIN 25 MG PO TABS: 25.0000 mg | ORAL_TABLET | Freq: Every day | ORAL | 0 refills | Status: DC

## 2024-05-01 MED ORDER — METFORMIN HCL 850 MG PO TABS: 850.0000 mg | ORAL_TABLET | Freq: Two times a day (BID) | ORAL | 2 refills | Status: DC

## 2024-05-01 NOTE — Progress Notes (Signed)
 Established Patient Office Visit  Subjective:  Patient ID: Kenneth Perez, male    DOB: 1961/06/07  Age: 63 y.o. MRN: [id number]  Chief Complaint  Patient presents with   Follow-up    Frequent urination at night.     No new complaints, here for lab review and medication refills. Labs reviewed and notable for uncontrolled diabetes, A1c not at target, lipids not at target with unremarkable cmp. Denies any hypoglycemic episodes and home bg readings have been high.     No other concerns at this time.   Past Medical History:  Diagnosis Date   Diabetes mellitus without complication (HCC)    GERD (gastroesophageal reflux disease)    Hypertension     Past Surgical History:  Procedure Laterality Date   GAS INSERTION Right 09/03/2022   Procedure: INSERTION OF GAS;  Surgeon: Valdemar Rogue, MD;  Location: [facility] OR;  Service: Ophthalmology;  Laterality: Right;   GAS/FLUID EXCHANGE Right 09/03/2022   Procedure: GAS/FLUID EXCHANGE;  Surgeon: Valdemar Rogue, MD;  Location: [facility] OR;  Service: Ophthalmology;  Laterality: Right;   LASER PHOTO ABLATION Right 09/03/2022   Procedure: LASER PHOTO ABLATION;  Surgeon: Valdemar Rogue, MD;  Location: [facility] OR;  Service: Ophthalmology;  Laterality: Right;   MEMBRANE PEEL Right 09/03/2022   Procedure: MEMBRANE PEEL;  Surgeon: Valdemar Rogue, MD;  Location: [facility] OR;  Service: Ophthalmology;  Laterality: Right;   PARS PLANA VITRECTOMY Right 09/03/2022   Procedure: PARS PLANA VITRECTOMY WITH 25 GAUGE;  Surgeon: Valdemar Rogue, MD;  Location: [facility] OR;  Service: Ophthalmology;  Laterality: Right;    Social History   Socioeconomic History   Marital status: Married    Spouse name: Not on file   Number of children: Not on file   Years of education: Not on file   Highest education level: Not on file  Occupational History   Occupation: Horticulturist, commercial: [employer]  Tobacco Use   Smoking status: Never   Smokeless tobacco: Never  Vaping Use   Vaping  status: Never Used  Substance and Sexual Activity   Alcohol use: Never   Drug use: Never   Sexual activity: Not Currently  Other Topics Concern   Not on file  Social History Narrative   ** Merged History Encounter **       Social Drivers of Corporate investment banker Strain: Not on file  Food Insecurity: Not on file  Transportation Needs: Not on file  Physical Activity: Not on file  Stress: Not on file  Social Connections: Not on file  Intimate Partner Violence: Not on file    No family history on file.  No Known Allergies  Outpatient Medications Prior to Visit  Medication Sig   amLODipine -olmesartan  (AZOR ) 5-20 MG tablet TAKE 1 TABLET BY MOUTH DAILY   glimepiride (AMARYL) 4 MG tablet Take 4 mg by mouth daily.   ibuprofen (ADVIL,MOTRIN) 200 MG tablet Take 200 mg by mouth every 6 (six) hours as needed for mild pain.   pantoprazole  (PROTONIX ) 20 MG tablet TAKE 1 TABLET(20 MG) BY MOUTH DAILY   prednisoLONE  acetate (PRED FORTE ) 1 % ophthalmic suspension Place 1 drop into the right eye 4 (four) times daily.   tamsulosin  (FLOMAX ) 0.4 MG CAPS capsule Take 1 capsule (0.4 mg total) by mouth daily after breakfast.   traMADol (ULTRAM) 50 MG tablet Take 1 tablet (50 mg total) by mouth every 12 (twelve) hours as needed.   Vitamin D , Ergocalciferol , (DRISDOL) 1.25 MG (50000  UNIT) CAPS capsule Take 50,000 Units by mouth every Thursday.   [DISCONTINUED] empagliflozin  (JARDIANCE ) 10 MG TABS tablet Take 1 tablet (10 mg total) by mouth daily before breakfast.   [DISCONTINUED] metFORMIN  (GLUCOPHAGE ) 500 MG tablet Take 500 mg by mouth 2 (two) times daily.   No facility-administered medications prior to visit.    ROS     Objective:   BP 118/82   Pulse 94   Temp 97.9 F (36.6 C)   Ht 5' 6 (1.676 m)   Wt 190 lb 6.4 oz (86.4 kg)   SpO2 97%   BMI 30.73 kg/m   Vitals:   05/01/24 1517  BP: 118/82  Pulse: 94  Temp: 97.9 F (36.6 C)  Height: 5' 6 (1.676 m)  Weight: 190 lb 6.4 oz  (86.4 kg)  SpO2: 97%  BMI (Calculated): 30.75    Physical Exam   Results for orders placed or performed in visit on 05/01/24  POCT CBG (Fasting - Glucose)  Result Value Ref Range   Glucose Fasting, POC 210 (A) 70 - 99 mg/dL    Recent Results (from the past 2160 hours)  POCT Glucose (CBG)     Status: Abnormal   Collection Time: 03/10/24  2:32 PM  Result Value Ref Range   POC Glucose 133 (A) 70 - 99 mg/dl  POCT Urinalysis Dipstick (18997)     Status: Normal   Collection Time: 03/10/24  2:40 PM  Result Value Ref Range   Color, UA yellow    Clarity, UA clear    Glucose, UA Negative Negative   Bilirubin, UA negative    Ketones, UA negative    Spec Grav, UA 1.025 1.010 - 1.025   Blood, UA negative    pH, UA 6.0 5.0 - 8.0   Protein, UA Negative Negative   Urobilinogen, UA 0.2 0.2 or 1.0 E.U./dL   Nitrite, UA negative    Leukocytes, UA Negative Negative   Appearance clear    Odor none   POC CREATINE & ALBUMIN,URINE     Status: Abnormal   Collection Time: 03/10/24  2:42 PM  Result Value Ref Range   Microalbumin Ur, POC 30-300 mg/L   Creatinine, POC 100 mg/dL   Albumin/Creatinine Ratio, Urine, POC 150   POCT Glucose (CBG)     Status: Abnormal   Collection Time: 04/10/24  2:11 PM  Result Value Ref Range   POC Glucose 232 (A) 70 - 99 mg/dl  CBC With Diff/Platelet     Status: Abnormal   Collection Time: 04/20/24  3:20 PM  Result Value Ref Range   WBC 6.3 3.4 - 10.8 x10E3/uL   RBC 5.81 (H) 4.14 - 5.80 x10E6/uL   Hemoglobin 16.2 13.0 - 17.7 g/dL   Hematocrit 50.3 62.4 - 51.0 %   MCV 85 79 - 97 fL   MCH 27.9 26.6 - 33.0 pg   MCHC 32.7 31.5 - 35.7 g/dL   RDW 86.7 88.3 - 84.5 %   Platelets 227 150 - 450 x10E3/uL   Neutrophils 57 Not Estab. %   Lymphs 34 Not Estab. %   Monocytes 7 Not Estab. %   Eos 1 Not Estab. %   Basos 1 Not Estab. %   Neutrophils Absolute 3.6 1.4 - 7.0 x10E3/uL   Lymphocytes Absolute 2.1 0.7 - 3.1 x10E3/uL   Monocytes Absolute 0.5 0.1 - 0.9 x10E3/uL    EOS (ABSOLUTE) 0.1 0.0 - 0.4 x10E3/uL   Basophils Absolute 0.0 0.0 - 0.2 x10E3/uL   Immature Granulocytes  0 Not Estab. %   Immature Grans (Abs) 0.0 0.0 - 0.1 x10E3/uL  Comprehensive metabolic panel with GFR     Status: Abnormal   Collection Time: 04/20/24  3:20 PM  Result Value Ref Range   Glucose 174 (H) 70 - 99 mg/dL   BUN 22 8 - 27 mg/dL   Creatinine, Ser 8.93 0.76 - 1.27 mg/dL   eGFR 80 >40 fO/fpw/8.26   BUN/Creatinine Ratio 21 10 - 24   Sodium 144 134 - 144 mmol/L   Potassium 4.3 3.5 - 5.2 mmol/L   Chloride 104 96 - 106 mmol/L   CO2 19 (L) 20 - 29 mmol/L   Calcium 10.0 8.6 - 10.2 mg/dL   Total Protein 7.8 6.0 - 8.5 g/dL   Albumin 5.1 (H) 3.9 - 4.9 g/dL   Globulin, Total 2.7 1.5 - 4.5 g/dL   Bilirubin Total 0.5 0.0 - 1.2 mg/dL   Alkaline Phosphatase 90 44 - 121 IU/L   AST 22 0 - 40 IU/L   ALT 23 0 - 44 IU/L  Lipid panel     Status: Abnormal   Collection Time: 04/20/24  3:20 PM  Result Value Ref Range   Cholesterol, Total 202 (H) 100 - 199 mg/dL   Triglycerides 80 0 - 149 mg/dL   HDL 56 >60 mg/dL   VLDL Cholesterol Cal 14 5 - 40 mg/dL   LDL Chol Calc (NIH) 867 (H) 0 - 99 mg/dL   Chol/HDL Ratio 3.6 0.0 - 5.0 ratio    Comment:                                   T. Chol/HDL Ratio                                             Men  Women                               1/2 Avg.Risk  3.4    3.3                                   Avg.Risk  5.0    4.4                                2X Avg.Risk  9.6    7.1                                3X Avg.Risk 23.4   11.0   PSA     Status: None   Collection Time: 04/20/24  3:20 PM  Result Value Ref Range   Prostate Specific Ag, Serum 0.7 0.0 - 4.0 ng/mL    Comment: Roche ECLIA methodology. According to the American Urological Association, Serum PSA should decrease and remain at undetectable levels after radical prostatectomy. The AUA defines biochemical recurrence as an initial PSA value 0.2 ng/mL or greater followed by a subsequent  confirmatory PSA value 0.2 ng/mL or greater. Values obtained with different assay methods or kits cannot be used interchangeably. Results cannot be  interpreted as absolute evidence of the presence or absence of malignant disease.   Hemoglobin A1c     Status: Abnormal   Collection Time: 04/20/24  3:20 PM  Result Value Ref Range   Hgb A1c MFr Bld 8.9 (H) 4.8 - 5.6 %    Comment:          Prediabetes: 5.7 - 6.4          Diabetes: >6.4          Glycemic control for adults with diabetes: <7.0    Est. average glucose Bld gHb Est-mCnc 209 mg/dL  POCT CBG (Fasting - Glucose)     Status: Abnormal   Collection Time: 05/01/24  3:26 PM  Result Value Ref Range   Glucose Fasting, POC 210 (A) 70 - 99 mg/dL      Assessment & Plan:   Problem List Items Addressed This Visit       Endocrine   Controlled type 2 diabetes mellitus without complication, without long-term current use of insulin  (HCC) - Primary   Relevant Medications   empagliflozin  (JARDIANCE ) 25 MG TABS tablet   metFORMIN  (GLUCOPHAGE ) 850 MG tablet   Other Relevant Orders   POCT CBG (Fasting - Glucose) (Completed)   Uncontrolled diabetes mellitus with hyperglycemia, without long-term current use of insulin  (HCC)   Relevant Medications   empagliflozin  (JARDIANCE ) 25 MG TABS tablet   metFORMIN  (GLUCOPHAGE ) 850 MG tablet     Other   Mixed hyperlipidemia   Other Visit Diagnoses       Diabetic polyneuropathy associated with type 2 diabetes mellitus (HCC)       Relevant Medications   empagliflozin  (JARDIANCE ) 25 MG TABS tablet   metFORMIN  (GLUCOPHAGE ) 850 MG tablet   gabapentin  (NEURONTIN ) 100 MG capsule     Primary hypertension           Return in about 6 weeks (around 06/12/2024) for fu with labs prior.   Total time spent: 20 minutes  Sherrill Cinderella Perry, MD  05/01/2024   This document may have been prepared by [facility] Voice Recognition software and as such may include unintentional dictation errors.

## 2024-06-11 ENCOUNTER — Other Ambulatory Visit: Payer: Self-pay | Admitting: Internal Medicine

## 2024-06-11 DIAGNOSIS — N401 Enlarged prostate with lower urinary tract symptoms: Secondary | ICD-10-CM

## 2024-06-23 ENCOUNTER — Ambulatory Visit: Admitting: Internal Medicine

## 2024-06-23 ENCOUNTER — Encounter: Payer: Self-pay | Admitting: Internal Medicine

## 2024-06-23 VITALS — BP 122/74 | HR 82 | Ht 66.0 in | Wt 192.0 lb

## 2024-06-23 DIAGNOSIS — K219 Gastro-esophageal reflux disease without esophagitis: Secondary | ICD-10-CM

## 2024-06-23 DIAGNOSIS — N401 Enlarged prostate with lower urinary tract symptoms: Secondary | ICD-10-CM | POA: Diagnosis not present

## 2024-06-23 DIAGNOSIS — E119 Type 2 diabetes mellitus without complications: Secondary | ICD-10-CM

## 2024-06-23 DIAGNOSIS — R351 Nocturia: Secondary | ICD-10-CM

## 2024-06-23 LAB — POCT CBG (FASTING - GLUCOSE)-MANUAL ENTRY: Glucose Fasting, POC: 133 mg/dL — AB (ref 70–99)

## 2024-06-23 MED ORDER — TAMSULOSIN HCL 0.4 MG PO CAPS: 0.4000 mg | ORAL_CAPSULE | Freq: Every day | ORAL | 1 refills | Status: AC

## 2024-06-23 MED ORDER — PANTOPRAZOLE SODIUM 40 MG PO TBEC: 40.0000 mg | DELAYED_RELEASE_TABLET | Freq: Every day | ORAL | 1 refills | Status: DC

## 2024-06-23 NOTE — Progress Notes (Signed)
 Established Patient Office Visit  Subjective:  Patient ID: Kenneth Perez, male    DOB: May 23, 1961  Age: 63 y.o. MRN: [id number]  Chief Complaint  Patient presents with   Follow-up    6 week lab results    No new complaints, here for lab review and medication refills. Denies any hypoglycemic episodes and home bg readings have been at target. Failed to have previsit labs done. States gerd uncontrolled on current dose of PPI.      No other concerns at this time.   Past Medical History:  Diagnosis Date   Diabetes mellitus without complication (HCC)    GERD (gastroesophageal reflux disease)    Hypertension     Past Surgical History:  Procedure Laterality Date   GAS INSERTION Right 09/03/2022   Procedure: INSERTION OF GAS;  Surgeon: Valdemar Rogue, MD;  Location: [facility] OR;  Service: Ophthalmology;  Laterality: Right;   GAS/FLUID EXCHANGE Right 09/03/2022   Procedure: GAS/FLUID EXCHANGE;  Surgeon: Valdemar Rogue, MD;  Location: [facility] OR;  Service: Ophthalmology;  Laterality: Right;   LASER PHOTO ABLATION Right 09/03/2022   Procedure: LASER PHOTO ABLATION;  Surgeon: Valdemar Rogue, MD;  Location: [facility] OR;  Service: Ophthalmology;  Laterality: Right;   MEMBRANE PEEL Right 09/03/2022   Procedure: MEMBRANE PEEL;  Surgeon: Valdemar Rogue, MD;  Location: [facility] OR;  Service: Ophthalmology;  Laterality: Right;   PARS PLANA VITRECTOMY Right 09/03/2022   Procedure: PARS PLANA VITRECTOMY WITH 25 GAUGE;  Surgeon: Valdemar Rogue, MD;  Location: [facility] OR;  Service: Ophthalmology;  Laterality: Right;    Social History   Socioeconomic History   Marital status: Married    Spouse name: Not on file   Number of children: Not on file   Years of education: Not on file   Highest education level: Not on file  Occupational History   Occupation: Horticulturist, commercial: [employer]  Tobacco Use   Smoking status: Never   Smokeless tobacco: Never  Vaping Use   Vaping status: Never Used  Substance and  Sexual Activity   Alcohol use: Never   Drug use: Never   Sexual activity: Not Currently  Other Topics Concern   Not on file  Social History Narrative   ** Merged History Encounter **       Social Drivers of Corporate investment banker Strain: Not on file  Food Insecurity: Not on file  Transportation Needs: Not on file  Physical Activity: Not on file  Stress: Not on file  Social Connections: Not on file  Intimate Partner Violence: Not on file    No family history on file.  No Known Allergies  Outpatient Medications Prior to Visit  Medication Sig   amLODipine -olmesartan  (AZOR ) 5-20 MG tablet TAKE 1 TABLET BY MOUTH DAILY   empagliflozin  (JARDIANCE ) 25 MG TABS tablet Take 1 tablet (25 mg total) by mouth daily before breakfast.   gabapentin  (NEURONTIN ) 100 MG capsule Take 1 capsule (100 mg total) by mouth 3 (three) times daily.   glimepiride (AMARYL) 4 MG tablet Take 4 mg by mouth daily.   ibuprofen (ADVIL,MOTRIN) 200 MG tablet Take 200 mg by mouth every 6 (six) hours as needed for mild pain.   metFORMIN  (GLUCOPHAGE ) 850 MG tablet Take 1 tablet (850 mg total) by mouth 2 (two) times daily with a meal.   prednisoLONE  acetate (PRED FORTE ) 1 % ophthalmic suspension Place 1 drop into the right eye 4 (four) times daily.   traMADol (ULTRAM) 50 MG  tablet Take 1 tablet (50 mg total) by mouth every 12 (twelve) hours as needed.   Vitamin D , Ergocalciferol , (DRISDOL) 1.25 MG (50000 UNIT) CAPS capsule Take 50,000 Units by mouth every Thursday.   [DISCONTINUED] pantoprazole  (PROTONIX ) 20 MG tablet TAKE 1 TABLET(20 MG) BY MOUTH DAILY   [DISCONTINUED] tamsulosin  (FLOMAX ) 0.4 MG CAPS capsule TAKE 1 CAPSULE BY MOUTH EVERY DAY   No facility-administered medications prior to visit.    Review of Systems  Constitutional:  Positive for weight loss.  HENT: Negative.    Eyes: Negative.   Respiratory: Negative.    Cardiovascular: Negative.   Gastrointestinal:  Positive for constipation (with ppi)  and heartburn.  Genitourinary:  Positive for frequency.  Skin: Negative.        Objective:   BP 122/74   Pulse 82   Ht 5' 6 (1.676 m)   Wt 192 lb (87.1 kg)   SpO2 96%   BMI 30.99 kg/m   Vitals:   06/23/24 1534  BP: 122/74  Pulse: 82  Height: 5' 6 (1.676 m)  Weight: 192 lb (87.1 kg)  SpO2: 96%  BMI (Calculated): 31    Physical Exam Vitals reviewed.  Constitutional:      Appearance: Normal appearance. He is obese.  HENT:     Head: Normocephalic.     Left Ear: There is no impacted cerumen.     Nose: Nose normal.     Mouth/Throat:     Mouth: Mucous membranes are moist.     Pharynx: No posterior oropharyngeal erythema.  Eyes:     Extraocular Movements: Extraocular movements intact.     Pupils: Pupils are equal, round, and reactive to light.  Cardiovascular:     Rate and Rhythm: Regular rhythm.     Chest Wall: PMI is not displaced.     Pulses: Normal pulses.     Heart sounds: Normal heart sounds. No murmur heard. Pulmonary:     Effort: Pulmonary effort is normal.     Breath sounds: Normal air entry. No rhonchi or rales.  Abdominal:     General: Abdomen is flat. Bowel sounds are normal. There is no distension.     Palpations: Abdomen is soft. There is no hepatomegaly, splenomegaly or mass.     Tenderness: There is no abdominal tenderness.  Musculoskeletal:        General: Normal range of motion.     Cervical back: Normal range of motion and neck supple.     Right lower leg: No edema.     Left lower leg: No edema.  Skin:    General: Skin is warm and dry.  Neurological:     General: No focal deficit present.     Mental Status: He is alert and oriented to person, place, and time.     Cranial Nerves: No cranial nerve deficit.     Motor: No weakness.  Psychiatric:        Mood and Affect: Mood normal.        Behavior: Behavior normal.      Results for orders placed or performed in visit on 06/23/24  POCT CBG (Fasting - Glucose)  Result Value Ref Range    Glucose Fasting, POC 133 (A) 70 - 99 mg/dL    Recent Results (from the past 2160 hours)  POCT Glucose (CBG)     Status: Abnormal   Collection Time: 04/10/24  2:11 PM  Result Value Ref Range   POC Glucose 232 (A) 70 - 99 mg/dl  CBC With Diff/Platelet     Status: Abnormal   Collection Time: 04/20/24  3:20 PM  Result Value Ref Range   WBC 6.3 3.4 - 10.8 x10E3/uL   RBC 5.81 (H) 4.14 - 5.80 x10E6/uL   Hemoglobin 16.2 13.0 - 17.7 g/dL   Hematocrit 50.3 62.4 - 51.0 %   MCV 85 79 - 97 fL   MCH 27.9 26.6 - 33.0 pg   MCHC 32.7 31.5 - 35.7 g/dL   RDW 86.7 88.3 - 84.5 %   Platelets 227 150 - 450 x10E3/uL   Neutrophils 57 Not Estab. %   Lymphs 34 Not Estab. %   Monocytes 7 Not Estab. %   Eos 1 Not Estab. %   Basos 1 Not Estab. %   Neutrophils Absolute 3.6 1.4 - 7.0 x10E3/uL   Lymphocytes Absolute 2.1 0.7 - 3.1 x10E3/uL   Monocytes Absolute 0.5 0.1 - 0.9 x10E3/uL   EOS (ABSOLUTE) 0.1 0.0 - 0.4 x10E3/uL   Basophils Absolute 0.0 0.0 - 0.2 x10E3/uL   Immature Granulocytes 0 Not Estab. %   Immature Grans (Abs) 0.0 0.0 - 0.1 x10E3/uL  Comprehensive metabolic panel with GFR     Status: Abnormal   Collection Time: 04/20/24  3:20 PM  Result Value Ref Range   Glucose 174 (H) 70 - 99 mg/dL   BUN 22 8 - 27 mg/dL   Creatinine, Ser 8.93 0.76 - 1.27 mg/dL   eGFR 80 >40 fO/fpw/8.26   BUN/Creatinine Ratio 21 10 - 24   Sodium 144 134 - 144 mmol/L   Potassium 4.3 3.5 - 5.2 mmol/L   Chloride 104 96 - 106 mmol/L   CO2 19 (L) 20 - 29 mmol/L   Calcium 10.0 8.6 - 10.2 mg/dL   Total Protein 7.8 6.0 - 8.5 g/dL   Albumin 5.1 (H) 3.9 - 4.9 g/dL   Globulin, Total 2.7 1.5 - 4.5 g/dL   Bilirubin Total 0.5 0.0 - 1.2 mg/dL   Alkaline Phosphatase 90 44 - 121 IU/L   AST 22 0 - 40 IU/L   ALT 23 0 - 44 IU/L  Lipid panel     Status: Abnormal   Collection Time: 04/20/24  3:20 PM  Result Value Ref Range   Cholesterol, Total 202 (H) 100 - 199 mg/dL   Triglycerides 80 0 - 149 mg/dL   HDL 56 >60 mg/dL   VLDL  Cholesterol Cal 14 5 - 40 mg/dL   LDL Chol Calc (NIH) 867 (H) 0 - 99 mg/dL   Chol/HDL Ratio 3.6 0.0 - 5.0 ratio    Comment:                                   T. Chol/HDL Ratio                                             Men  Women                               1/2 Avg.Risk  3.4    3.3                                   Avg.Risk  5.0    4.4                                2X Avg.Risk  9.6    7.1                                3X Avg.Risk 23.4   11.0   PSA     Status: None   Collection Time: 04/20/24  3:20 PM  Result Value Ref Range   Prostate Specific Ag, Serum 0.7 0.0 - 4.0 ng/mL    Comment: Roche ECLIA methodology. According to the American Urological Association, Serum PSA should decrease and remain at undetectable levels after radical prostatectomy. The AUA defines biochemical recurrence as an initial PSA value 0.2 ng/mL or greater followed by a subsequent confirmatory PSA value 0.2 ng/mL or greater. Values obtained with different assay methods or kits cannot be used interchangeably. Results cannot be interpreted as absolute evidence of the presence or absence of malignant disease.   Hemoglobin A1c     Status: Abnormal   Collection Time: 04/20/24  3:20 PM  Result Value Ref Range   Hgb A1c MFr Bld 8.9 (H) 4.8 - 5.6 %    Comment:          Prediabetes: 5.7 - 6.4          Diabetes: >6.4          Glycemic control for adults with diabetes: <7.0    Est. average glucose Bld gHb Est-mCnc 209 mg/dL  POCT CBG (Fasting - Glucose)     Status: Abnormal   Collection Time: 05/01/24  3:26 PM  Result Value Ref Range   Glucose Fasting, POC 210 (A) 70 - 99 mg/dL  POCT CBG (Fasting - Glucose)     Status: Abnormal   Collection Time: 06/23/24  3:38 PM  Result Value Ref Range   Glucose Fasting, POC 133 (A) 70 - 99 mg/dL      Assessment & Plan:  Nameer was seen today for follow-up.  Controlled type 2 diabetes mellitus without complication, without long-term current use of insulin  (HCC) -      POCT CBG (Fasting - Glucose) -     Lipid panel; Standing -     Hemoglobin A1c; Standing  Benign prostatic hyperplasia with nocturia -     Tamsulosin  HCl; Take 1 capsule (0.4 mg total) by mouth daily.  Dispense: 90 capsule; Refill: 1  Gastroesophageal reflux disease without esophagitis -     Pantoprazole  Sodium; Take 1 tablet (40 mg total) by mouth daily.  Dispense: 90 tablet; Refill: 1    Problem List Items Addressed This Visit       Digestive   Gastroesophageal reflux disease without esophagitis   Relevant Medications   pantoprazole  (PROTONIX ) 40 MG tablet     Endocrine   Controlled type 2 diabetes mellitus without complication, without long-term current use of insulin  (HCC) - Primary   Relevant Orders   POCT CBG (Fasting - Glucose) (Completed)   Lipid panel   Hemoglobin A1c     Other   Benign prostatic hyperplasia with nocturia   Relevant Medications   tamsulosin  (FLOMAX ) 0.4 MG CAPS capsule    Return in about 2 weeks (around 07/07/2024) for fu with labs prior.   Total time spent: 20 minutes  Sherrill Cinderella Perry, MD  06/23/2024   This document  may have been prepared by [employer] Voice Recognition software and as such may include unintentional dictation errors.

## 2024-07-07 ENCOUNTER — Other Ambulatory Visit: Payer: Self-pay | Admitting: Physician Assistant

## 2024-07-07 ENCOUNTER — Ambulatory Visit
Admission: RE | Admit: 2024-07-07 | Discharge: 2024-07-07 | Disposition: A | Discharge status: Home / Self Care | Payer: Worker's Compensation | Source: Ambulatory Visit | Attending: Physician Assistant | Admitting: Physician Assistant

## 2024-07-07 ENCOUNTER — Ambulatory Visit
Admission: RE | Admit: 2024-07-07 | Discharge: 2024-07-07 | Disposition: A | Discharge status: Home / Self Care | Source: Ambulatory Visit | Attending: Physician Assistant | Admitting: Physician Assistant

## 2024-07-07 DIAGNOSIS — X58XXXA Exposure to other specified factors, initial encounter: Secondary | ICD-10-CM | POA: Diagnosis not present

## 2024-07-07 DIAGNOSIS — Y990 Civilian activity done for income or pay: Secondary | ICD-10-CM | POA: Diagnosis not present

## 2024-07-07 DIAGNOSIS — M7989 Other specified soft tissue disorders: Secondary | ICD-10-CM | POA: Insufficient documentation

## 2024-07-10 ENCOUNTER — Other Ambulatory Visit

## 2024-07-11 LAB — LIPID PANEL
Chol/HDL Ratio: 3.6 ratio (ref 0.0–5.0)
Cholesterol, Total: 188 mg/dL (ref 100–199)
HDL: 52 mg/dL (ref >39)
LDL Chol Calc (NIH): 119 mg/dL — ABNORMAL HIGH (ref 0–99)
Triglycerides: 91 mg/dL (ref 0–149)
VLDL Cholesterol Cal: 17 mg/dL (ref 5–40)

## 2024-07-11 LAB — HEMOGLOBIN A1C
Est. average glucose Bld gHb Est-mCnc: 192 mg/dL
Hgb A1c MFr Bld: 8.3 % — ABNORMAL HIGH (ref 4.8–5.6)

## 2024-07-21 ENCOUNTER — Ambulatory Visit: Admitting: Internal Medicine

## 2024-07-28 ENCOUNTER — Ambulatory Visit: Payer: Self-pay | Admitting: Internal Medicine

## 2024-07-28 ENCOUNTER — Ambulatory Visit: Admitting: Internal Medicine

## 2024-07-28 ENCOUNTER — Other Ambulatory Visit: Payer: Self-pay | Admitting: Internal Medicine

## 2024-07-28 ENCOUNTER — Encounter: Payer: Self-pay | Admitting: Internal Medicine

## 2024-07-28 VITALS — BP 136/92 | HR 70 | Temp 97.7°F | Ht 66.0 in | Wt 187.0 lb

## 2024-07-28 DIAGNOSIS — E1142 Type 2 diabetes mellitus with diabetic polyneuropathy: Secondary | ICD-10-CM

## 2024-07-28 DIAGNOSIS — E782 Mixed hyperlipidemia: Secondary | ICD-10-CM | POA: Diagnosis not present

## 2024-07-28 DIAGNOSIS — E1121 Type 2 diabetes mellitus with diabetic nephropathy: Secondary | ICD-10-CM

## 2024-07-28 DIAGNOSIS — Z0130 Encounter for examination of blood pressure without abnormal findings: Secondary | ICD-10-CM

## 2024-07-28 DIAGNOSIS — E1165 Type 2 diabetes mellitus with hyperglycemia: Secondary | ICD-10-CM | POA: Diagnosis not present

## 2024-07-28 DIAGNOSIS — J301 Allergic rhinitis due to pollen: Secondary | ICD-10-CM

## 2024-07-28 DIAGNOSIS — E119 Type 2 diabetes mellitus without complications: Secondary | ICD-10-CM

## 2024-07-28 LAB — POCT CBG (FASTING - GLUCOSE)-MANUAL ENTRY: Glucose Fasting, POC: 97 mg/dL (ref 70–99)

## 2024-07-28 MED ORDER — FLUTICASONE PROPIONATE 50 MCG/ACT NA SUSP: 1.0000 | Freq: Every day | NASAL | 2 refills | Status: AC

## 2024-07-28 MED ORDER — ONETOUCH ULTRA 2 W/DEVICE KIT: 1.0000 | PACK | Freq: Once | 0 refills | Status: AC

## 2024-07-28 MED ORDER — RYBELSUS 7 MG PO TABS: 7.0000 mg | ORAL_TABLET | Freq: Every day | ORAL | 0 refills | Status: DC

## 2024-07-28 MED ORDER — RYBELSUS 3 MG PO TABS: 3.0000 mg | ORAL_TABLET | Freq: Every day | ORAL | Status: DC

## 2024-07-28 MED ORDER — ATORVASTATIN CALCIUM 10 MG PO TABS: 10.0000 mg | ORAL_TABLET | Freq: Every day | ORAL | 1 refills | Status: DC

## 2024-07-28 MED ORDER — ONETOUCH ULTRASOFT 2 LANCETS MISC: 1.0000 | Freq: Two times a day (BID) | 3 refills | Status: AC

## 2024-07-28 MED ORDER — METFORMIN HCL 1000 MG PO TABS: 1000.0000 mg | ORAL_TABLET | Freq: Two times a day (BID) | ORAL | 0 refills | Status: DC

## 2024-07-28 MED ORDER — EMPAGLIFLOZIN 25 MG PO TABS: 25.0000 mg | ORAL_TABLET | Freq: Every day | ORAL | 0 refills | Status: AC

## 2024-07-28 MED ORDER — CETIRIZINE HCL 10 MG PO TABS: 10.0000 mg | ORAL_TABLET | Freq: Every day | ORAL | 2 refills | Status: DC

## 2024-07-28 MED ORDER — ONETOUCH ULTRA TEST VI STRP: ORAL_STRIP | 12 refills | Status: AC

## 2024-07-28 NOTE — Progress Notes (Signed)
 Established Patient Office Visit  Subjective:  Patient ID: Kenneth Perez, male    DOB: 1961-02-12  Age: 63 y.o. MRN: [id number]  Chief Complaint  Patient presents with   Follow-up    2 week follow up with labs. Requesting a new glucose meter and allergy medication.    No new complaints, here for lab review and medication refills. Labs reviewed and notable for uncontrolled diabetes, A1c not at target, lipids not at target. Denies any hypoglycemic episodes and home bg readings have been at target. Also c/o flare of seasonal allergies.    No other concerns at this time.   Past Medical History:  Diagnosis Date   Diabetes mellitus without complication (HCC)    GERD (gastroesophageal reflux disease)    Hypertension     Past Surgical History:  Procedure Laterality Date   GAS INSERTION Right 09/03/2022   Procedure: INSERTION OF GAS;  Surgeon: Valdemar Rogue, MD;  Location: [facility] OR;  Service: Ophthalmology;  Laterality: Right;   GAS/FLUID EXCHANGE Right 09/03/2022   Procedure: GAS/FLUID EXCHANGE;  Surgeon: Valdemar Rogue, MD;  Location: [facility] OR;  Service: Ophthalmology;  Laterality: Right;   LASER PHOTO ABLATION Right 09/03/2022   Procedure: LASER PHOTO ABLATION;  Surgeon: Valdemar Rogue, MD;  Location: [facility] OR;  Service: Ophthalmology;  Laterality: Right;   MEMBRANE PEEL Right 09/03/2022   Procedure: MEMBRANE PEEL;  Surgeon: Valdemar Rogue, MD;  Location: [facility] OR;  Service: Ophthalmology;  Laterality: Right;   PARS PLANA VITRECTOMY Right 09/03/2022   Procedure: PARS PLANA VITRECTOMY WITH 25 GAUGE;  Surgeon: Valdemar Rogue, MD;  Location: [facility] OR;  Service: Ophthalmology;  Laterality: Right;    Social History   Socioeconomic History   Marital status: Married    Spouse name: Not on file   Number of children: Not on file   Years of education: Not on file   Highest education level: Not on file  Occupational History   Occupation: Horticulturist, commercial: [employer]  Tobacco Use    Smoking status: Never   Smokeless tobacco: Never  Vaping Use   Vaping status: Never Used  Substance and Sexual Activity   Alcohol use: Never   Drug use: Never   Sexual activity: Not Currently  Other Topics Concern   Not on file  Social History Narrative   ** Merged History Encounter **       Social Drivers of Corporate investment banker Strain: Not on file  Food Insecurity: Not on file  Transportation Needs: Not on file  Physical Activity: Not on file  Stress: Not on file  Social Connections: Not on file  Intimate Partner Violence: Not on file    No family history on file.  No Known Allergies  Outpatient Medications Prior to Visit  Medication Sig   amLODipine -olmesartan  (AZOR ) 5-20 MG tablet TAKE 1 TABLET BY MOUTH DAILY   gabapentin  (NEURONTIN ) 100 MG capsule Take 1 capsule (100 mg total) by mouth 3 (three) times daily.   pantoprazole  (PROTONIX ) 40 MG tablet Take 1 tablet (40 mg total) by mouth daily.   tamsulosin  (FLOMAX ) 0.4 MG CAPS capsule Take 1 capsule (0.4 mg total) by mouth daily.   [DISCONTINUED] empagliflozin  (JARDIANCE ) 25 MG TABS tablet Take 1 tablet (25 mg total) by mouth daily before breakfast.   [DISCONTINUED] metFORMIN  (GLUCOPHAGE ) 850 MG tablet Take 1 tablet (850 mg total) by mouth 2 (two) times daily with a meal.   glimepiride (AMARYL) 4 MG tablet Take 4 mg by  mouth daily. (Patient not taking: Reported on 07/28/2024)   ibuprofen (ADVIL,MOTRIN) 200 MG tablet Take 200 mg by mouth every 6 (six) hours as needed for mild pain. (Patient not taking: Reported on 07/28/2024)   prednisoLONE  acetate (PRED FORTE ) 1 % ophthalmic suspension Place 1 drop into the right eye 4 (four) times daily. (Patient not taking: Reported on 07/28/2024)   traMADol (ULTRAM) 50 MG tablet Take 1 tablet (50 mg total) by mouth every 12 (twelve) hours as needed. (Patient not taking: Reported on 07/28/2024)   Vitamin D , Ergocalciferol , (DRISDOL) 1.25 MG (50000 UNIT) CAPS capsule Take 50,000  Units by mouth every Thursday. (Patient not taking: Reported on 07/28/2024)   No facility-administered medications prior to visit.    Review of Systems  Constitutional:  Positive for weight loss (3 lbs).  HENT:  Positive for congestion.   Eyes: Negative.   Respiratory:  Positive for cough.   Cardiovascular: Negative.   Gastrointestinal:  Positive for constipation (with ppi) and heartburn.  Genitourinary:  Positive for frequency.  Skin: Negative.        Objective:   BP (!) 136/92   Pulse 70   Temp 97.7 F (36.5 C)   Ht 5' 6 (1.676 m)   Wt 187 lb (84.8 kg)   SpO2 99%   BMI 30.18 kg/m   Vitals:   07/28/24 1530  BP: (!) 136/92  Pulse: 70  Temp: 97.7 F (36.5 C)  Height: 5' 6 (1.676 m)  Weight: 187 lb (84.8 kg)  SpO2: 99%  BMI (Calculated): 30.2    Physical Exam Vitals reviewed.  Constitutional:      Appearance: Normal appearance. He is obese.  HENT:     Head: Normocephalic.     Left Ear: There is no impacted cerumen.     Nose: Nose normal.     Mouth/Throat:     Mouth: Mucous membranes are moist.     Pharynx: No posterior oropharyngeal erythema.  Eyes:     Extraocular Movements: Extraocular movements intact.     Pupils: Pupils are equal, round, and reactive to light.  Cardiovascular:     Rate and Rhythm: Regular rhythm.     Chest Wall: PMI is not displaced.     Pulses: Normal pulses.     Heart sounds: Normal heart sounds. No murmur heard. Pulmonary:     Effort: Pulmonary effort is normal.     Breath sounds: Normal air entry. No rhonchi or rales.  Abdominal:     General: Abdomen is flat. Bowel sounds are normal. There is no distension.     Palpations: Abdomen is soft. There is no hepatomegaly, splenomegaly or mass.     Tenderness: There is no abdominal tenderness.  Musculoskeletal:        General: Normal range of motion.     Cervical back: Normal range of motion and neck supple.     Right lower leg: No edema.     Left lower leg: No edema.  Skin:     General: Skin is warm and dry.  Neurological:     General: No focal deficit present.     Mental Status: He is alert and oriented to person, place, and time.     Cranial Nerves: No cranial nerve deficit.     Motor: No weakness.  Psychiatric:        Mood and Affect: Mood normal.        Behavior: Behavior normal.      Results for orders placed or performed  in visit on 07/28/24  POCT CBG (Fasting - Glucose)  Result Value Ref Range   Glucose Fasting, POC 97 70 - 99 mg/dL    Recent Results (from the past 2160 hours)  POCT CBG (Fasting - Glucose)     Status: Abnormal   Collection Time: 05/01/24  3:26 PM  Result Value Ref Range   Glucose Fasting, POC 210 (A) 70 - 99 mg/dL  POCT CBG (Fasting - Glucose)     Status: Abnormal   Collection Time: 06/23/24  3:38 PM  Result Value Ref Range   Glucose Fasting, POC 133 (A) 70 - 99 mg/dL  Hemoglobin J8r     Status: Abnormal   Collection Time: 07/10/24  3:28 PM  Result Value Ref Range   Hgb A1c MFr Bld 8.3 (H) 4.8 - 5.6 %    Comment:          Prediabetes: 5.7 - 6.4          Diabetes: >6.4          Glycemic control for adults with diabetes: <7.0    Est. average glucose Bld gHb Est-mCnc 192 mg/dL  Lipid panel     Status: Abnormal   Collection Time: 07/10/24  3:29 PM  Result Value Ref Range   Cholesterol, Total 188 100 - 199 mg/dL   Triglycerides 91 0 - 149 mg/dL   HDL 52 >60 mg/dL   VLDL Cholesterol Cal 17 5 - 40 mg/dL   LDL Chol Calc (NIH) 880 (H) 0 - 99 mg/dL   Chol/HDL Ratio 3.6 0.0 - 5.0 ratio    Comment:                                   T. Chol/HDL Ratio                                             Men  Women                               1/2 Avg.Risk  3.4    3.3                                   Avg.Risk  5.0    4.4                                2X Avg.Risk  9.6    7.1                                3X Avg.Risk 23.4   11.0   POCT CBG (Fasting - Glucose)     Status: Abnormal   Collection Time: 07/28/24  3:39 PM  Result Value  Ref Range   Glucose Fasting, POC 97 70 - 99 mg/dL      Assessment & Plan:  Laden was seen today for follow-up.  Diabetic polyneuropathy associated with type 2 diabetes mellitus (HCC) -     POCT CBG (Fasting - Glucose) -     metFORMIN  HCl; Take 1 tablet (1,000 mg total)  by mouth 2 (two) times daily with a meal.  Dispense: 180 tablet; Refill: 0 -     Rybelsus; Take 1 tablet (7 mg total) by mouth daily.  Dispense: 30 tablet; Refill: 0 -     Rybelsus; Take 1 tablet (3 mg total) by mouth daily. -     Empagliflozin ; Take 1 tablet (25 mg total) by mouth daily before breakfast.  Dispense: 90 tablet; Refill: 0 -     OneTouch Ultra 2; 1 Device by Does not apply route once for 1 dose.  Dispense: 1 kit; Refill: 0 -     OneTouch Ultra Test; Use as instructed  Dispense: 100 each; Refill: 12 -     OneTouch UltraSoft 2 Lancets; 1 Lancet by Does not apply route 2 (two) times daily.  Dispense: 180 each; Refill: 3  Uncontrolled diabetes mellitus with hyperglycemia, without long-term current use of insulin  (HCC) -     Fructosamine  Microalbuminuric diabetic nephropathy (HCC)  Mixed hyperlipidemia -     Atorvastatin Calcium; Take 1 tablet (10 mg total) by mouth daily.  Dispense: 90 tablet; Refill: 1  Seasonal allergic rhinitis due to pollen -     Fluticasone Propionate; Place 1 spray into both nostrils daily.  Dispense: 16 mL; Refill: 2 -     Cetirizine HCl; Take 1 tablet (10 mg total) by mouth daily.  Dispense: 30 tablet; Refill: 2    Problem List Items Addressed This Visit       Endocrine   Microalbuminuric diabetic nephropathy (HCC)   Relevant Medications   atorvastatin (LIPITOR) 10 MG tablet   metFORMIN  (GLUCOPHAGE ) 1000 MG tablet   Semaglutide (RYBELSUS) 7 MG TABS   Semaglutide (RYBELSUS) 3 MG TABS   empagliflozin  (JARDIANCE ) 25 MG TABS tablet   Uncontrolled diabetes mellitus with hyperglycemia, without long-term current use of insulin  (HCC)   Relevant Medications   atorvastatin  (LIPITOR) 10 MG tablet   metFORMIN  (GLUCOPHAGE ) 1000 MG tablet   Semaglutide (RYBELSUS) 7 MG TABS   Semaglutide (RYBELSUS) 3 MG TABS   empagliflozin  (JARDIANCE ) 25 MG TABS tablet   Other Relevant Orders   Fructosamine     Other   Mixed hyperlipidemia   Relevant Medications   atorvastatin (LIPITOR) 10 MG tablet   Other Visit Diagnoses       Diabetic polyneuropathy associated with type 2 diabetes mellitus (HCC)    -  Primary   Relevant Medications   atorvastatin (LIPITOR) 10 MG tablet   metFORMIN  (GLUCOPHAGE ) 1000 MG tablet   Semaglutide (RYBELSUS) 7 MG TABS   Semaglutide (RYBELSUS) 3 MG TABS   empagliflozin  (JARDIANCE ) 25 MG TABS tablet   Blood Glucose Monitoring Suppl (ONE TOUCH ULTRA 2) w/Device KIT   glucose blood (ONETOUCH ULTRA TEST) test strip   OneTouch UltraSoft 2 Lancets MISC   Other Relevant Orders   POCT CBG (Fasting - Glucose) (Completed)     Seasonal allergic rhinitis due to pollen       Relevant Medications   fluticasone (FLONASE) 50 MCG/ACT nasal spray   cetirizine (ZYRTEC ALLERGY) 10 MG tablet       Return in about 6 weeks (around 09/08/2024) for fu with labs prior.   Total time spent: 20 minutes  Sherrill Cinderella Perry, MD  07/28/2024   This document may have been prepared by [facility] Voice Recognition software and as such may include unintentional dictation errors.

## 2024-09-08 ENCOUNTER — Ambulatory Visit: Admitting: Internal Medicine

## 2024-09-18 ENCOUNTER — Encounter: Payer: Self-pay | Admitting: Internal Medicine

## 2024-09-18 ENCOUNTER — Ambulatory Visit: Admitting: Internal Medicine

## 2024-09-18 VITALS — BP 154/68 | HR 90 | Ht 68.0 in | Wt 188.4 lb

## 2024-09-18 DIAGNOSIS — N469 Male infertility, unspecified: Secondary | ICD-10-CM

## 2024-09-18 DIAGNOSIS — N401 Enlarged prostate with lower urinary tract symptoms: Secondary | ICD-10-CM | POA: Diagnosis not present

## 2024-09-18 DIAGNOSIS — Z789 Other specified health status: Secondary | ICD-10-CM

## 2024-09-18 DIAGNOSIS — E1142 Type 2 diabetes mellitus with diabetic polyneuropathy: Secondary | ICD-10-CM | POA: Diagnosis not present

## 2024-09-18 DIAGNOSIS — E1165 Type 2 diabetes mellitus with hyperglycemia: Secondary | ICD-10-CM | POA: Diagnosis not present

## 2024-09-18 DIAGNOSIS — R351 Nocturia: Secondary | ICD-10-CM

## 2024-09-18 DIAGNOSIS — I10 Essential (primary) hypertension: Secondary | ICD-10-CM

## 2024-09-18 LAB — POCT CBG (FASTING - GLUCOSE)-MANUAL ENTRY: Glucose Fasting, POC: 109 mg/dL — AB (ref 70–99)

## 2024-09-18 MED ORDER — MEFLOQUINE HCL 250 MG PO TABS: 250.0000 mg | ORAL_TABLET | ORAL | 0 refills | Status: DC

## 2024-09-18 NOTE — Progress Notes (Signed)
 Established Patient Office Visit  Subjective:  Patient ID: Kenneth Perez, male    DOB: April 15, 1961  Age: 63 y.o. MRN: [id number]  Chief Complaint  Patient presents with   Results    6 week lab results. Needs month supply of tamsulosin . Wants to be on omeprazole    No new complaints, here for lab review and medication refills. Failed to have previsit labs done. Concerned about infertility as he'Malky Rudzinski seeking to impregnate his newlywed.      No other concerns at this time.   Past Medical History:  Diagnosis Date   Diabetes mellitus without complication (HCC)    GERD (gastroesophageal reflux disease)    Hypertension     Past Surgical History:  Procedure Laterality Date   GAS INSERTION Right 09/03/2022   Procedure: INSERTION OF GAS;  Surgeon: Valdemar Rogue, MD;  Location: [facility] OR;  Service: Ophthalmology;  Laterality: Right;   GAS/FLUID EXCHANGE Right 09/03/2022   Procedure: GAS/FLUID EXCHANGE;  Surgeon: Valdemar Rogue, MD;  Location: [facility] OR;  Service: Ophthalmology;  Laterality: Right;   LASER PHOTO ABLATION Right 09/03/2022   Procedure: LASER PHOTO ABLATION;  Surgeon: Valdemar Rogue, MD;  Location: [facility] OR;  Service: Ophthalmology;  Laterality: Right;   MEMBRANE PEEL Right 09/03/2022   Procedure: MEMBRANE PEEL;  Surgeon: Valdemar Rogue, MD;  Location: [facility] OR;  Service: Ophthalmology;  Laterality: Right;   PARS PLANA VITRECTOMY Right 09/03/2022   Procedure: PARS PLANA VITRECTOMY WITH 25 GAUGE;  Surgeon: Valdemar Rogue, MD;  Location: [facility] OR;  Service: Ophthalmology;  Laterality: Right;    Social History   Socioeconomic History   Marital status: Married    Spouse name: Not on file   Number of children: Not on file   Years of education: Not on file   Highest education level: Not on file  Occupational History   Occupation: Horticulturist, Commercial: [employer]  Tobacco Use   Smoking status: Never   Smokeless tobacco: Never  Vaping Use   Vaping status: Never Used  Substance  and Sexual Activity   Alcohol use: Never   Drug use: Never   Sexual activity: Not Currently  Other Topics Concern   Not on file  Social History Narrative   ** Merged History Encounter **       Social Drivers of Corporate Investment Banker Strain: Not on file  Food Insecurity: Not on file  Transportation Needs: Not on file  Physical Activity: Not on file  Stress: Not on file  Social Connections: Not on file  Intimate Partner Violence: Not on file    No family history on file.  No Known Allergies  Outpatient Medications Prior to Visit  Medication Sig   amLODipine -olmesartan  (AZOR ) 5-20 MG tablet TAKE 1 TABLET BY MOUTH DAILY   atorvastatin  (LIPITOR) 10 MG tablet Take 1 tablet (10 mg total) by mouth daily.   cetirizine  (ZYRTEC  ALLERGY) 10 MG tablet Take 1 tablet (10 mg total) by mouth daily.   empagliflozin  (JARDIANCE ) 25 MG TABS tablet Take 1 tablet (25 mg total) by mouth daily before breakfast.   fluticasone  (FLONASE ) 50 MCG/ACT nasal spray Place 1 spray into both nostrils daily.   gabapentin  (NEURONTIN ) 100 MG capsule Take 1 capsule (100 mg total) by mouth 3 (three) times daily.   glucose blood (ONETOUCH ULTRA TEST) test strip Use as instructed   metFORMIN  (GLUCOPHAGE ) 1000 MG tablet Take 1 tablet (1,000 mg total) by mouth 2 (two) times daily with a meal.  OneTouch UltraSoft 2 Lancets MISC 1 Lancet by Does not apply route 2 (two) times daily.   tamsulosin  (FLOMAX ) 0.4 MG CAPS capsule Take 1 capsule (0.4 mg total) by mouth daily.   glimepiride (AMARYL) 4 MG tablet Take 4 mg by mouth daily. (Patient not taking: Reported on 09/18/2024)   ibuprofen (ADVIL,MOTRIN) 200 MG tablet Take 200 mg by mouth every 6 (six) hours as needed for mild pain. (Patient not taking: Reported on 09/18/2024)   pantoprazole  (PROTONIX ) 40 MG tablet Take 1 tablet (40 mg total) by mouth daily. (Patient not taking: Reported on 09/18/2024)   prednisoLONE  acetate (PRED FORTE ) 1 % ophthalmic suspension Place 1  drop into the right eye 4 (four) times daily. (Patient not taking: Reported on 09/18/2024)   traMADol (ULTRAM) 50 MG tablet Take 1 tablet (50 mg total) by mouth every 12 (twelve) hours as needed. (Patient not taking: Reported on 09/18/2024)   Vitamin D , Ergocalciferol , (DRISDOL) 1.25 MG (50000 UNIT) CAPS capsule Take 50,000 Units by mouth every Thursday. (Patient not taking: Reported on 09/18/2024)   No facility-administered medications prior to visit.    Review of Systems  Constitutional:  Negative for weight loss (gained 1 lb).  HENT: Negative.  Negative for congestion.   Gastrointestinal:  Positive for constipation (with ppi) and heartburn.  Genitourinary:  Positive for frequency.  Skin: Negative.        Objective:   BP (!) 154/68   Pulse 90   Ht 5' 8 (1.727 m)   Wt 188 lb 6.4 oz (85.5 kg)   SpO2 97%   BMI 28.65 kg/m   Vitals:   09/18/24 1530  BP: (!) 154/68  Pulse: 90  Height: 5' 8 (1.727 m)  Weight: 188 lb 6.4 oz (85.5 kg)  SpO2: 97%  BMI (Calculated): 28.65    Physical Exam Vitals reviewed.  Constitutional:      Appearance: Normal appearance. He is obese.  HENT:     Head: Normocephalic.     Left Ear: There is no impacted cerumen.     Nose: Nose normal.     Mouth/Throat:     Mouth: Mucous membranes are moist.     Pharynx: No posterior oropharyngeal erythema.  Eyes:     Extraocular Movements: Extraocular movements intact.     Pupils: Pupils are equal, round, and reactive to light.  Cardiovascular:     Rate and Rhythm: Regular rhythm.     Chest Wall: PMI is not displaced.     Pulses: Normal pulses.     Heart sounds: Normal heart sounds. No murmur heard. Pulmonary:     Effort: Pulmonary effort is normal.     Breath sounds: Normal air entry. No rhonchi or rales.  Abdominal:     General: Abdomen is flat. Bowel sounds are normal. There is no distension.     Palpations: Abdomen is soft. There is no hepatomegaly, splenomegaly or mass.     Tenderness: There is  no abdominal tenderness.  Musculoskeletal:        General: Normal range of motion.     Cervical back: Normal range of motion and neck supple.     Right lower leg: No edema.     Left lower leg: No edema.  Skin:    General: Skin is warm and dry.  Neurological:     General: No focal deficit present.     Mental Status: He is alert and oriented to person, place, and time.     Cranial Nerves: No cranial  nerve deficit.     Motor: No weakness.  Psychiatric:        Mood and Affect: Mood normal.        Behavior: Behavior normal.            Assessment & Plan:  Bernarr was seen today for results.  Infertility male -     Semen Analysis, Basic  Benign prostatic hyperplasia with nocturia  Uncontrolled diabetes mellitus with hyperglycemia, without long-term current use of insulin  (HCC) -     POCT CBG (Fasting - Glucose)  Diabetic polyneuropathy associated with type 2 diabetes mellitus (HCC)  History of international travel -     Mefloquine HCl; Take 1 tablet (250 mg total) by mouth every 7 (seven) days. Start 1 wk before travel, take weekly throughout trip and continue for 4 wks after return to the [place]   Dispense: 9 tablet; Refill: 0    Problem List Items Addressed This Visit       Endocrine   Uncontrolled diabetes mellitus with hyperglycemia, without long-term current use of insulin  (HCC)   Relevant Orders   POCT CBG (Fasting - Glucose) (Completed)     Other   Benign prostatic hyperplasia with nocturia   Other Visit Diagnoses       Infertility male    -  Primary   Relevant Orders   Semen Analysis, Basic     Diabetic polyneuropathy associated with type 2 diabetes mellitus (HCC)         History of international travel       Relevant Medications   mefloquine (LARIAM) 250 MG tablet       Return in about 6 weeks (around 10/30/2024) for fu with labs prior.   Total time spent: 30 minutes. This time includes review of previous notes and results and patient face to face  interaction during today'Inocencio Roy visit.    Sherrill Cinderella Perry, MD  09/18/2024   This document may have been prepared by [facility] Voice Recognition software and as such may include unintentional dictation errors.

## 2024-09-20 LAB — FRUCTOSAMINE: Fructosamine: 327 umol/L — ABNORMAL HIGH (ref 0–285)

## 2024-10-20 ENCOUNTER — Other Ambulatory Visit: Payer: Self-pay | Admitting: Internal Medicine

## 2024-10-20 DIAGNOSIS — E1142 Type 2 diabetes mellitus with diabetic polyneuropathy: Secondary | ICD-10-CM

## 2024-10-24 DIAGNOSIS — J301 Allergic rhinitis due to pollen: Secondary | ICD-10-CM

## 2024-11-03 ENCOUNTER — Ambulatory Visit: Payer: Self-pay | Admitting: Internal Medicine

## 2024-11-03 ENCOUNTER — Ambulatory Visit: Admitting: Internal Medicine

## 2024-11-03 VITALS — BP 144/98 | HR 81 | Temp 97.8°F | Ht 68.0 in | Wt 189.4 lb

## 2024-11-03 DIAGNOSIS — I10 Essential (primary) hypertension: Secondary | ICD-10-CM | POA: Diagnosis not present

## 2024-11-03 DIAGNOSIS — E1142 Type 2 diabetes mellitus with diabetic polyneuropathy: Secondary | ICD-10-CM | POA: Diagnosis not present

## 2024-11-03 DIAGNOSIS — E119 Type 2 diabetes mellitus without complications: Secondary | ICD-10-CM

## 2024-11-03 DIAGNOSIS — E1165 Type 2 diabetes mellitus with hyperglycemia: Secondary | ICD-10-CM | POA: Diagnosis not present

## 2024-11-03 LAB — POCT CBG (FASTING - GLUCOSE)-MANUAL ENTRY: Glucose Fasting, POC: 159 mg/dL — AB (ref 70–99)

## 2024-11-03 MED ORDER — GABAPENTIN 100 MG PO CAPS: 100.0000 mg | ORAL_CAPSULE | Freq: Three times a day (TID) | ORAL | 2 refills | Status: AC

## 2024-11-03 MED ORDER — RYBELSUS 14 MG PO TABS: 14.0000 mg | ORAL_TABLET | Freq: Every day | ORAL | 0 refills | Status: AC

## 2024-11-03 MED ORDER — AMLODIPINE-OLMESARTAN 5-40 MG PO TABS: 1.0000 | ORAL_TABLET | Freq: Every day | ORAL | 0 refills | Status: AC

## 2024-11-03 NOTE — Progress Notes (Signed)
 "  Established Patient Office Visit  Subjective:  Patient ID: Kenneth Perez, male    DOB: Sep 15, 1961  Age: 64 y.o. MRN: [id number]  Chief Complaint  Patient presents with   Follow-up    6 week follow up lab results    HPI  No other concerns at this time.   Past Medical History:  Diagnosis Date   Diabetes mellitus without complication (HCC)    GERD (gastroesophageal reflux disease)    Hypertension     Past Surgical History:  Procedure Laterality Date   GAS INSERTION Right 09/03/2022   Procedure: INSERTION OF GAS;  Surgeon: Valdemar Rogue, MD;  Location: [facility] OR;  Service: Ophthalmology;  Laterality: Right;   GAS/FLUID EXCHANGE Right 09/03/2022   Procedure: GAS/FLUID EXCHANGE;  Surgeon: Valdemar Rogue, MD;  Location: [facility] OR;  Service: Ophthalmology;  Laterality: Right;   LASER PHOTO ABLATION Right 09/03/2022   Procedure: LASER PHOTO ABLATION;  Surgeon: Valdemar Rogue, MD;  Location: [facility] OR;  Service: Ophthalmology;  Laterality: Right;   MEMBRANE PEEL Right 09/03/2022   Procedure: MEMBRANE PEEL;  Surgeon: Valdemar Rogue, MD;  Location: [facility] OR;  Service: Ophthalmology;  Laterality: Right;   PARS PLANA VITRECTOMY Right 09/03/2022   Procedure: PARS PLANA VITRECTOMY WITH 25 GAUGE;  Surgeon: Valdemar Rogue, MD;  Location: [facility] OR;  Service: Ophthalmology;  Laterality: Right;    Social History   Socioeconomic History   Marital status: Married    Spouse name: Not on file   Number of children: Not on file   Years of education: Not on file   Highest education level: Not on file  Occupational History   Occupation: Horticulturist, Commercial: [employer]  Tobacco Use   Smoking status: Never   Smokeless tobacco: Never  Vaping Use   Vaping status: Never Used  Substance and Sexual Activity   Alcohol use: Never   Drug use: Never   Sexual activity: Not Currently  Other Topics Concern   Not on file  Social History Narrative   ** Merged History Encounter **       Social Drivers of  Health   Tobacco Use: Low Risk (09/18/2024)   Patient History    Smoking Tobacco Use: Never    Smokeless Tobacco Use: Never    Passive Exposure: Not on file  Financial Resource Strain: Not on file  Food Insecurity: Not on file  Transportation Needs: Not on file  Physical Activity: Not on file  Stress: Not on file  Social Connections: Not on file  Intimate Partner Violence: Not on file  Depression (PHQ2-9): High Risk (04/10/2024)   Depression (PHQ2-9)    PHQ-2 Score: 11  Alcohol Screen: Not on file  Housing: Not on file  Utilities: Not on file  Health Literacy: Not on file    No family history on file.  Allergies[1]  Show/hide medication list[2]  Review of Systems  Constitutional:  Positive for weight loss.  HENT: Negative.    Eyes: Negative.   Respiratory: Negative.    Cardiovascular: Negative.   Gastrointestinal:  Positive for constipation (with ppi) and heartburn.  Genitourinary:  Positive for frequency.  Skin: Negative.        Objective:   BP (!) 144/98   Pulse 81   Temp 97.8 F (36.6 C)   Ht 5' 8 (1.727 m)   Wt 189 lb 6.4 oz (85.9 kg)   SpO2 98%   BMI 28.80 kg/m   Vitals:   11/03/24 1501  BP: ROLLEN)  144/98  Pulse: 81  Temp: 97.8 F (36.6 C)  Height: 5' 8 (1.727 m)  Weight: 189 lb 6.4 oz (85.9 kg)  SpO2: 98%  BMI (Calculated): 28.8    Physical Exam Vitals reviewed.  Constitutional:      Appearance: Normal appearance. He is obese.  HENT:     Head: Normocephalic.     Left Ear: There is no impacted cerumen.     Nose: Nose normal.     Mouth/Throat:     Mouth: Mucous membranes are moist.     Pharynx: No posterior oropharyngeal erythema.  Eyes:     Extraocular Movements: Extraocular movements intact.     Pupils: Pupils are equal, round, and reactive to light.  Cardiovascular:     Rate and Rhythm: Regular rhythm.     Chest Wall: PMI is not displaced.     Pulses: Normal pulses.     Heart sounds: Normal heart sounds. No murmur  heard. Pulmonary:     Effort: Pulmonary effort is normal.     Breath sounds: Normal air entry. No rhonchi or rales.  Abdominal:     General: Abdomen is flat. Bowel sounds are normal. There is no distension.     Palpations: Abdomen is soft. There is no hepatomegaly, splenomegaly or mass.     Tenderness: There is no abdominal tenderness.  Musculoskeletal:        General: Normal range of motion.     Cervical back: Normal range of motion and neck supple.     Right lower leg: No edema.     Left lower leg: No edema.  Skin:    General: Skin is warm and dry.  Neurological:     General: No focal deficit present.     Mental Status: He is alert and oriented to person, place, and time.     Cranial Nerves: No cranial nerve deficit.     Motor: No weakness.  Psychiatric:        Mood and Affect: Mood normal.        Behavior: Behavior normal.      Results for orders placed or performed in visit on 11/03/24  POCT CBG (Fasting - Glucose)  Result Value Ref Range   Glucose Fasting, POC 159 (A) 70 - 99 mg/dL    Recent Results (from the past 2160 hours)  POCT CBG (Fasting - Glucose)     Status: Abnormal   Collection Time: 09/18/24  3:44 PM  Result Value Ref Range   Glucose Fasting, POC 109 (A) 70 - 99 mg/dL  Fructosamine     Status: Abnormal   Collection Time: 09/19/24  3:46 PM  Result Value Ref Range   Fructosamine 327 (H) 0 - 285 umol/L    Comment: Published reference interval for apparently healthy subjects between age 62 and 70 is 5 - 285 umol/L and in a poorly controlled diabetic population is 228 - 563 umol/L with a mean of 396 umol/L.   POCT CBG (Fasting - Glucose)     Status: Abnormal   Collection Time: 11/03/24  3:07 PM  Result Value Ref Range   Glucose Fasting, POC 159 (A) 70 - 99 mg/dL      Assessment & Plan:   Problem List Items Addressed This Visit       Endocrine   Controlled type 2 diabetes mellitus without complication, without long-term current use of insulin   (HCC)   Relevant Medications   Semaglutide  (RYBELSUS ) 14 MG TABS   amLODipine -olmesartan  (AZOR ) 5-40  MG tablet   Other Relevant Orders   POCT CBG (Fasting - Glucose) (Completed)   Uncontrolled diabetes mellitus with hyperglycemia, without long-term current use of insulin  (HCC) - Primary   Relevant Medications   Semaglutide  (RYBELSUS ) 14 MG TABS   amLODipine -olmesartan  (AZOR ) 5-40 MG tablet   Other Visit Diagnoses       Primary hypertension       Relevant Medications   amLODipine -olmesartan  (AZOR ) 5-40 MG tablet   Other Relevant Orders   Comprehensive metabolic panel with GFR     Diabetic polyneuropathy associated with type 2 diabetes mellitus (HCC)       Relevant Medications   gabapentin  (NEURONTIN ) 100 MG capsule   Semaglutide  (RYBELSUS ) 14 MG TABS   amLODipine -olmesartan  (AZOR ) 5-40 MG tablet       Return in about 3 weeks (around 11/24/2024) for fu with labs prior.   Total time spent: 20 minutes. This time includes review of previous notes and results and patient face to face interaction during today'Reene Harlacher visit.    Sherrill Cinderella Perry, MD  11/03/2024   This document may have been prepared by [facility] Voice Recognition software and as such may include unintentional dictation errors.     [1] No Known Allergies [2]  Outpatient Medications Prior to Visit  Medication Sig   atorvastatin  (LIPITOR) 10 MG tablet Take 1 tablet (10 mg total) by mouth daily.   cetirizine  (ZYRTEC ) 10 MG tablet TAKE 1 TABLET BY MOUTH EVERY DAY   fluticasone  (FLONASE ) 50 MCG/ACT nasal spray Place 1 spray into both nostrils daily.   glucose blood (ONETOUCH ULTRA TEST) test strip Use as instructed   metFORMIN  (GLUCOPHAGE ) 1000 MG tablet TAKE 1 TABLET (1,000 MG TOTAL) BY MOUTH TWICE A DAY WITH FOOD   OneTouch UltraSoft 2 Lancets MISC 1 Lancet by Does not apply route 2 (two) times daily.   tamsulosin  (FLOMAX ) 0.4 MG CAPS capsule Take 1 capsule (0.4 mg total) by mouth daily.   [DISCONTINUED]  amLODipine -olmesartan  (AZOR ) 5-20 MG tablet TAKE 1 TABLET BY MOUTH DAILY   [DISCONTINUED] gabapentin  (NEURONTIN ) 100 MG capsule Take 1 capsule (100 mg total) by mouth 3 (three) times daily.   [DISCONTINUED] mefloquine  (LARIAM ) 250 MG tablet Take 1 tablet (250 mg total) by mouth every 7 (seven) days. Start 1 wk before travel, take weekly throughout trip and continue for 4 wks after return to the [place]    glimepiride (AMARYL) 4 MG tablet Take 4 mg by mouth daily. (Patient not taking: Reported on 11/03/2024)   ibuprofen (ADVIL,MOTRIN) 200 MG tablet Take 200 mg by mouth every 6 (six) hours as needed for mild pain. (Patient not taking: Reported on 11/03/2024)   pantoprazole  (PROTONIX ) 40 MG tablet Take 1 tablet (40 mg total) by mouth daily. (Patient not taking: Reported on 11/03/2024)   prednisoLONE  acetate (PRED FORTE ) 1 % ophthalmic suspension Place 1 drop into the right eye 4 (four) times daily. (Patient not taking: Reported on 11/03/2024)   traMADol  (ULTRAM ) 50 MG tablet Take 1 tablet (50 mg total) by mouth every 12 (twelve) hours as needed. (Patient not taking: Reported on 11/03/2024)   Vitamin D , Ergocalciferol , (DRISDOL) 1.25 MG (50000 UNIT) CAPS capsule Take 50,000 Units by mouth every Thursday. (Patient not taking: Reported on 11/03/2024)   No facility-administered medications prior to visit.   "

## 2024-11-24 ENCOUNTER — Encounter: Payer: Self-pay | Admitting: Internal Medicine

## 2024-11-24 ENCOUNTER — Ambulatory Visit: Admitting: Internal Medicine

## 2024-11-24 VITALS — BP 130/88 | HR 90 | Ht 68.0 in | Wt 189.0 lb

## 2024-11-24 DIAGNOSIS — E1142 Type 2 diabetes mellitus with diabetic polyneuropathy: Secondary | ICD-10-CM

## 2024-11-24 DIAGNOSIS — K219 Gastro-esophageal reflux disease without esophagitis: Secondary | ICD-10-CM

## 2024-11-24 DIAGNOSIS — E119 Type 2 diabetes mellitus without complications: Secondary | ICD-10-CM

## 2024-11-24 DIAGNOSIS — E1165 Type 2 diabetes mellitus with hyperglycemia: Secondary | ICD-10-CM

## 2024-11-24 DIAGNOSIS — E1121 Type 2 diabetes mellitus with diabetic nephropathy: Secondary | ICD-10-CM

## 2024-11-24 DIAGNOSIS — E782 Mixed hyperlipidemia: Secondary | ICD-10-CM

## 2024-11-24 LAB — POCT CBG (FASTING - GLUCOSE)-MANUAL ENTRY: Glucose Fasting, POC: 100 mg/dL — AB (ref 70–99)

## 2024-11-24 MED ORDER — OMEPRAZOLE 40 MG PO CPDR: 40.0000 mg | DELAYED_RELEASE_CAPSULE | Freq: Every day | ORAL | 2 refills | Status: AC

## 2024-11-24 MED ORDER — ATORVASTATIN CALCIUM 20 MG PO TABS: 20.0000 mg | ORAL_TABLET | Freq: Every day | ORAL | 11 refills | Status: AC

## 2024-11-24 MED ORDER — GLIMEPIRIDE 4 MG PO TABS: 4.0000 mg | ORAL_TABLET | Freq: Every day | ORAL | 0 refills | Status: AC

## 2024-11-24 NOTE — Progress Notes (Signed)
 "  Established Patient Office Visit  Subjective:  Patient ID: Kenneth Perez, male    DOB: 1961/03/03  Age: 64 y.o. MRN: [id number]  Chief Complaint  Patient presents with   Follow-up    Wants to discuss medication and also look at his finger he slammed in the car door  Missed labs due to us  being closed     No new complaints, here for lab review and medication refills. Labs reviewed and notable for uncontrolled diabetes, rising A1c not at target, lipids at target with unremarkable cmp. Denies any hypoglycemic episodes and home bg readings have been at target.   No other concerns at this time.   Past Medical History:  Diagnosis Date   Diabetes mellitus without complication (HCC)    GERD (gastroesophageal reflux disease)    Hypertension     Past Surgical History:  Procedure Laterality Date   GAS INSERTION Right 09/03/2022   Procedure: INSERTION OF GAS;  Surgeon: Valdemar Rogue, MD;  Location: [facility] OR;  Service: Ophthalmology;  Laterality: Right;   GAS/FLUID EXCHANGE Right 09/03/2022   Procedure: GAS/FLUID EXCHANGE;  Surgeon: Valdemar Rogue, MD;  Location: [facility] OR;  Service: Ophthalmology;  Laterality: Right;   LASER PHOTO ABLATION Right 09/03/2022   Procedure: LASER PHOTO ABLATION;  Surgeon: Valdemar Rogue, MD;  Location: [facility] OR;  Service: Ophthalmology;  Laterality: Right;   MEMBRANE PEEL Right 09/03/2022   Procedure: MEMBRANE PEEL;  Surgeon: Valdemar Rogue, MD;  Location: [facility] OR;  Service: Ophthalmology;  Laterality: Right;   PARS PLANA VITRECTOMY Right 09/03/2022   Procedure: PARS PLANA VITRECTOMY WITH 25 GAUGE;  Surgeon: Valdemar Rogue, MD;  Location: [facility] OR;  Service: Ophthalmology;  Laterality: Right;    Social History   Socioeconomic History   Marital status: Married    Spouse name: Not on file   Number of children: Not on file   Years of education: Not on file   Highest education level: Not on file  Occupational History   Occupation: Horticulturist, Commercial: [employer]   Tobacco Use   Smoking status: Never   Smokeless tobacco: Never  Vaping Use   Vaping status: Never Used  Substance and Sexual Activity   Alcohol use: Never   Drug use: Never   Sexual activity: Not Currently  Other Topics Concern   Not on file  Social History Narrative   ** Merged History Encounter **       Social Drivers of Health   Tobacco Use: Low Risk (09/18/2024)   Patient History    Smoking Tobacco Use: Never    Smokeless Tobacco Use: Never    Passive Exposure: Not on file  Financial Resource Strain: Not on file  Food Insecurity: Not on file  Transportation Needs: Not on file  Physical Activity: Not on file  Stress: Not on file  Social Connections: Not on file  Intimate Partner Violence: Not on file  Depression (PHQ2-9): High Risk (04/10/2024)   Depression (PHQ2-9)    PHQ-2 Score: 11  Alcohol Screen: Not on file  Housing: Not on file  Utilities: Not on file  Health Literacy: Not on file    No family history on file.  Allergies[1]  Show/hide medication list[2]  Review of Systems  Constitutional:  Negative for weight loss (gained 1 lb).  HENT: Negative.  Negative for congestion.   Gastrointestinal:  Positive for constipation (with ppi) and heartburn.  Genitourinary:  Positive for frequency.  Skin: Negative.  Objective:   BP 130/88   Pulse 90   Ht 5' 8 (1.727 m)   Wt 189 lb (85.7 kg)   SpO2 99%   BMI 28.74 kg/m   Vitals:   11/24/24 1503  BP: 130/88  Pulse: 90  Height: 5' 8 (1.727 m)  Weight: 189 lb (85.7 kg)  SpO2: 99%  BMI (Calculated): 28.74    Physical Exam Vitals reviewed.  Constitutional:      Appearance: Normal appearance. He is obese.  HENT:     Head: Normocephalic.     Left Ear: There is no impacted cerumen.     Nose: Nose normal.     Mouth/Throat:     Mouth: Mucous membranes are moist.     Pharynx: No posterior oropharyngeal erythema.  Eyes:     Extraocular Movements: Extraocular movements intact.     Pupils:  Pupils are equal, round, and reactive to light.  Cardiovascular:     Rate and Rhythm: Regular rhythm.     Chest Wall: PMI is not displaced.     Pulses: Normal pulses.     Heart sounds: Normal heart sounds. No murmur heard. Pulmonary:     Effort: Pulmonary effort is normal.     Breath sounds: Normal air entry. No rhonchi or rales.  Abdominal:     General: Abdomen is flat. Bowel sounds are normal. There is no distension.     Palpations: Abdomen is soft. There is no hepatomegaly, splenomegaly or mass.     Tenderness: There is no abdominal tenderness.  Musculoskeletal:        General: Normal range of motion.     Cervical back: Normal range of motion and neck supple.     Right lower leg: No edema.     Left lower leg: No edema.  Skin:    General: Skin is warm and dry.  Neurological:     General: No focal deficit present.     Mental Status: He is alert and oriented to person, place, and time.     Cranial Nerves: No cranial nerve deficit.     Motor: No weakness.  Psychiatric:        Mood and Affect: Mood normal.        Behavior: Behavior normal.      Results for orders placed or performed in visit on 11/24/24  POCT CBG (Fasting - Glucose)  Result Value Ref Range   Glucose Fasting, POC 100 (A) 70 - 99 mg/dL    Recent Results (from the past 2160 hours)  POCT CBG (Fasting - Glucose)     Status: Abnormal   Collection Time: 09/18/24  3:44 PM  Result Value Ref Range   Glucose Fasting, POC 109 (A) 70 - 99 mg/dL  Fructosamine     Status: Abnormal   Collection Time: 09/19/24  3:46 PM  Result Value Ref Range   Fructosamine 327 (H) 0 - 285 umol/L    Comment: Published reference interval for apparently healthy subjects between age 86 and 25 is 30 - 285 umol/L and in a poorly controlled diabetic population is 228 - 563 umol/L with a mean of 396 umol/L.   POCT CBG (Fasting - Glucose)     Status: Abnormal   Collection Time: 11/03/24  3:07 PM  Result Value Ref Range   Glucose Fasting,  POC 159 (A) 70 - 99 mg/dL  POCT CBG (Fasting - Glucose)     Status: Abnormal   Collection Time: 11/24/24  3:13 PM  Result  Value Ref Range   Glucose Fasting, POC 100 (A) 70 - 99 mg/dL      Assessment & Plan:  Becker was seen today for follow-up.  Uncontrolled diabetes mellitus with hyperglycemia, without long-term current use of insulin  (HCC) -     Glimepiride ; Take 1 tablet (4 mg total) by mouth daily.  Dispense: 90 tablet; Refill: 0 -     Fructosamine  Controlled type 2 diabetes mellitus without complication, without long-term current use of insulin  (HCC) -     POCT CBG (Fasting - Glucose)  Diabetic polyneuropathy associated with type 2 diabetes mellitus (HCC)  Gastroesophageal reflux disease without esophagitis -     Omeprazole ; Take 1 capsule (40 mg total) by mouth daily.  Dispense: 30 capsule; Refill: 2  Mixed hyperlipidemia -     Atorvastatin  Calcium ; Take 1 tablet (20 mg total) by mouth daily.  Dispense: 30 tablet; Refill: 11  Microalbuminuric diabetic nephropathy (HCC)    Problem List Items Addressed This Visit       Digestive   Gastroesophageal reflux disease without esophagitis   Relevant Medications   omeprazole  (PRILOSEC) 40 MG capsule     Endocrine   Microalbuminuric diabetic nephropathy (HCC)   Relevant Medications   glimepiride  (AMARYL ) 4 MG tablet   atorvastatin  (LIPITOR) 20 MG tablet   Controlled type 2 diabetes mellitus without complication, without long-term current use of insulin  (HCC)   Relevant Medications   glimepiride  (AMARYL ) 4 MG tablet   atorvastatin  (LIPITOR) 20 MG tablet   Other Relevant Orders   POCT CBG (Fasting - Glucose) (Completed)   Uncontrolled diabetes mellitus with hyperglycemia, without long-term current use of insulin  (HCC) - Primary   Relevant Medications   glimepiride  (AMARYL ) 4 MG tablet   atorvastatin  (LIPITOR) 20 MG tablet   Other Relevant Orders   Fructosamine     Other   Mixed hyperlipidemia   Relevant  Medications   atorvastatin  (LIPITOR) 20 MG tablet   Other Visit Diagnoses       Diabetic polyneuropathy associated with type 2 diabetes mellitus (HCC)       Relevant Medications   glimepiride  (AMARYL ) 4 MG tablet   atorvastatin  (LIPITOR) 20 MG tablet       Return in about 2 months (around 01/22/2025) for fu with labs prior.   Total time spent: 20 minutes. This time includes review of previous notes and results and patient face to face interaction during today'Nyjah Denio visit.    Sherrill Cinderella Perry, MD  11/24/2024   This document may have been prepared by [facility] Voice Recognition software and as such may include unintentional dictation errors.     [1] No Known Allergies [2]  Outpatient Medications Prior to Visit  Medication Sig   amLODipine -olmesartan  (AZOR ) 5-40 MG tablet Take 1 tablet by mouth daily.   cetirizine  (ZYRTEC ) 10 MG tablet TAKE 1 TABLET BY MOUTH EVERY DAY   fluticasone  (FLONASE ) 50 MCG/ACT nasal spray Place 1 spray into both nostrils daily.   gabapentin  (NEURONTIN ) 100 MG capsule Take 1 capsule (100 mg total) by mouth 3 (three) times daily.   glucose blood (ONETOUCH ULTRA TEST) test strip Use as instructed   metFORMIN  (GLUCOPHAGE ) 1000 MG tablet TAKE 1 TABLET (1,000 MG TOTAL) BY MOUTH TWICE A DAY WITH FOOD   OneTouch UltraSoft 2 Lancets MISC 1 Lancet by Does not apply route 2 (two) times daily.   Semaglutide  (RYBELSUS ) 14 MG TABS Take 1 tablet (14 mg total) by mouth daily.   tamsulosin  (FLOMAX ) 0.4 MG  CAPS capsule Take 1 capsule (0.4 mg total) by mouth daily.   [DISCONTINUED] atorvastatin  (LIPITOR) 10 MG tablet Take 1 tablet (10 mg total) by mouth daily.   ibuprofen (ADVIL,MOTRIN) 200 MG tablet Take 200 mg by mouth every 6 (six) hours as needed for mild pain. (Patient not taking: Reported on 11/24/2024)   prednisoLONE  acetate (PRED FORTE ) 1 % ophthalmic suspension Place 1 drop into the right eye 4 (four) times daily. (Patient not taking: Reported on 11/03/2024)    traMADol  (ULTRAM ) 50 MG tablet Take 1 tablet (50 mg total) by mouth every 12 (twelve) hours as needed. (Patient not taking: Reported on 11/03/2024)   Vitamin D , Ergocalciferol , (DRISDOL) 1.25 MG (50000 UNIT) CAPS capsule Take 50,000 Units by mouth every Thursday. (Patient not taking: Reported on 11/03/2024)   [DISCONTINUED] glimepiride  (AMARYL ) 4 MG tablet Take 4 mg by mouth daily. (Patient not taking: Reported on 11/03/2024)   [DISCONTINUED] pantoprazole  (PROTONIX ) 40 MG tablet Take 1 tablet (40 mg total) by mouth daily. (Patient not taking: Reported on 11/03/2024)   No facility-administered medications prior to visit.   "

## 2025-02-02 ENCOUNTER — Ambulatory Visit: Admitting: Internal Medicine
